# Patient Record
Sex: Female | Born: 1935 | Race: White | Hispanic: No | Smoking: Never smoker
Health system: Southern US, Community
[De-identification: ages and names within clinical notes are randomized; demographics above are authoritative.]

## PROBLEM LIST (undated history)

## (undated) DIAGNOSIS — F0390 Unspecified dementia without behavioral disturbance: Secondary | ICD-10-CM

## (undated) DIAGNOSIS — M810 Age-related osteoporosis without current pathological fracture: Secondary | ICD-10-CM

## (undated) DIAGNOSIS — E7800 Pure hypercholesterolemia, unspecified: Secondary | ICD-10-CM

## (undated) DIAGNOSIS — I10 Essential (primary) hypertension: Secondary | ICD-10-CM

## (undated) DIAGNOSIS — R42 Dizziness and giddiness: Secondary | ICD-10-CM

## (undated) DIAGNOSIS — C801 Malignant (primary) neoplasm, unspecified: Secondary | ICD-10-CM

## (undated) DIAGNOSIS — E538 Deficiency of other specified B group vitamins: Secondary | ICD-10-CM

## (undated) DIAGNOSIS — I4891 Unspecified atrial fibrillation: Secondary | ICD-10-CM

## (undated) HISTORY — PX: SHOULDER SURGERY: SHX246

## (undated) HISTORY — DX: Unspecified atrial fibrillation: I48.91

## (undated) HISTORY — DX: Age-related osteoporosis without current pathological fracture: M81.0

## (undated) HISTORY — PX: APPENDECTOMY: SHX54

## (undated) HISTORY — PX: MANDIBLE SURGERY: SHX707

## (undated) HISTORY — DX: Unspecified dementia, unspecified severity, without behavioral disturbance, psychotic disturbance, mood disturbance, and anxiety: F03.90

## (undated) HISTORY — PX: CERVICAL DISC SURGERY: SHX588

## (undated) HISTORY — PX: LYMPHADENECTOMY: SHX15

---

## 2000-08-09 ENCOUNTER — Inpatient Hospital Stay (HOSPITAL_COMMUNITY): Admission: RE | Admit: 2000-08-09 | Discharge: 2000-08-11 | Payer: Self-pay | Admitting: Neurological Surgery

## 2000-08-09 ENCOUNTER — Encounter: Payer: Self-pay | Admitting: Neurological Surgery

## 2000-09-09 ENCOUNTER — Encounter: Payer: Self-pay | Admitting: Neurological Surgery

## 2000-09-09 ENCOUNTER — Encounter: Admission: RE | Admit: 2000-09-09 | Discharge: 2000-09-09 | Payer: Self-pay | Admitting: Neurological Surgery

## 2002-01-07 ENCOUNTER — Emergency Department (HOSPITAL_COMMUNITY): Admission: EM | Admit: 2002-01-07 | Discharge: 2002-01-07 | Payer: Self-pay | Admitting: Internal Medicine

## 2003-10-07 ENCOUNTER — Ambulatory Visit (HOSPITAL_COMMUNITY): Admission: RE | Admit: 2003-10-07 | Discharge: 2003-10-07 | Payer: Self-pay | Admitting: Family Medicine

## 2004-05-27 ENCOUNTER — Ambulatory Visit (HOSPITAL_COMMUNITY): Admission: RE | Admit: 2004-05-27 | Discharge: 2004-05-27 | Payer: Self-pay | Admitting: Internal Medicine

## 2004-05-27 HISTORY — PX: COLONOSCOPY: SHX174

## 2005-03-03 ENCOUNTER — Ambulatory Visit (HOSPITAL_COMMUNITY): Admission: RE | Admit: 2005-03-03 | Discharge: 2005-03-03 | Payer: Self-pay | Admitting: Family Medicine

## 2005-04-01 ENCOUNTER — Ambulatory Visit (HOSPITAL_COMMUNITY): Admission: RE | Admit: 2005-04-01 | Discharge: 2005-04-01 | Payer: Self-pay | Admitting: Family Medicine

## 2006-07-25 ENCOUNTER — Ambulatory Visit (HOSPITAL_COMMUNITY): Admission: RE | Admit: 2006-07-25 | Discharge: 2006-07-25 | Payer: Self-pay | Admitting: Obstetrics and Gynecology

## 2006-10-16 ENCOUNTER — Emergency Department (HOSPITAL_COMMUNITY): Admission: EM | Admit: 2006-10-16 | Discharge: 2006-10-16 | Payer: Self-pay | Admitting: Emergency Medicine

## 2006-10-25 ENCOUNTER — Ambulatory Visit (HOSPITAL_COMMUNITY): Admission: RE | Admit: 2006-10-25 | Discharge: 2006-10-25 | Payer: Self-pay | Admitting: Family Medicine

## 2007-02-07 ENCOUNTER — Ambulatory Visit (HOSPITAL_COMMUNITY): Admission: RE | Admit: 2007-02-07 | Discharge: 2007-02-07 | Payer: Self-pay | Admitting: Family Medicine

## 2007-06-27 ENCOUNTER — Emergency Department (HOSPITAL_COMMUNITY): Admission: EM | Admit: 2007-06-27 | Discharge: 2007-06-27 | Payer: Self-pay | Admitting: Emergency Medicine

## 2007-09-28 ENCOUNTER — Inpatient Hospital Stay (HOSPITAL_COMMUNITY): Admission: EM | Admit: 2007-09-28 | Discharge: 2007-10-01 | Payer: Self-pay | Admitting: Emergency Medicine

## 2008-07-18 ENCOUNTER — Emergency Department (HOSPITAL_COMMUNITY): Admission: EM | Admit: 2008-07-18 | Discharge: 2008-07-18 | Payer: Self-pay | Admitting: Emergency Medicine

## 2008-09-30 ENCOUNTER — Emergency Department (HOSPITAL_COMMUNITY): Admission: EM | Admit: 2008-09-30 | Discharge: 2008-09-30 | Payer: Self-pay | Admitting: Emergency Medicine

## 2008-12-23 ENCOUNTER — Ambulatory Visit (HOSPITAL_COMMUNITY): Admission: RE | Admit: 2008-12-23 | Discharge: 2008-12-23 | Payer: Self-pay | Admitting: Family Medicine

## 2009-05-12 ENCOUNTER — Encounter: Payer: Self-pay | Admitting: Emergency Medicine

## 2009-05-12 ENCOUNTER — Inpatient Hospital Stay (HOSPITAL_COMMUNITY): Admission: EM | Admit: 2009-05-12 | Discharge: 2009-05-13 | Payer: Self-pay | Admitting: Cardiology

## 2009-10-28 HISTORY — PX: CARDIAC CATHETERIZATION: SHX172

## 2009-11-23 ENCOUNTER — Observation Stay (HOSPITAL_COMMUNITY): Admission: EM | Admit: 2009-11-23 | Discharge: 2009-11-26 | Payer: Self-pay | Admitting: Emergency Medicine

## 2009-11-25 ENCOUNTER — Other Ambulatory Visit: Payer: Self-pay | Admitting: Cardiology

## 2009-11-25 ENCOUNTER — Other Ambulatory Visit: Payer: Self-pay | Admitting: Internal Medicine

## 2009-11-26 ENCOUNTER — Other Ambulatory Visit: Payer: Self-pay | Admitting: Cardiology

## 2010-08-31 ENCOUNTER — Emergency Department (HOSPITAL_COMMUNITY)
Admission: EM | Admit: 2010-08-31 | Discharge: 2010-08-31 | Payer: Self-pay | Source: Home / Self Care | Admitting: Emergency Medicine

## 2010-11-09 LAB — DIFFERENTIAL
Basophils Absolute: 0 10*3/uL (ref 0.0–0.1)
Lymphs Abs: 1.4 10*3/uL (ref 0.7–4.0)
Monocytes Absolute: 0.8 10*3/uL (ref 0.1–1.0)
Monocytes Relative: 11 % (ref 3–12)
Neutro Abs: 5.1 10*3/uL (ref 1.7–7.7)
Neutrophils Relative %: 69 % (ref 43–77)

## 2010-11-09 LAB — BASIC METABOLIC PANEL
BUN: 15 mg/dL (ref 6–23)
GFR calc Af Amer: >60 mL/min (ref >60)
GFR calc non Af Amer: 54 mL/min — ABNORMAL LOW (ref >60)
Glucose, Bld: 114 mg/dL — ABNORMAL HIGH (ref 70–99)
Sodium: 144 mEq/L (ref 135–145)

## 2010-11-09 LAB — CBC
Hemoglobin: 13.5 g/dL (ref 12.0–15.0)
MCH: 31.8 pg (ref 26.0–34.0)
Platelets: 200 10*3/uL (ref 150–400)
RBC: 4.25 MIL/uL (ref 3.87–5.11)
WBC: 7.4 10*3/uL (ref 4.0–10.5)

## 2010-11-20 LAB — BASIC METABOLIC PANEL
BUN: 16 mg/dL (ref 6–23)
CO2: 25 mEq/L (ref 19–32)
Calcium: 8 mg/dL — ABNORMAL LOW (ref 8.4–10.5)
Chloride: 113 mEq/L — ABNORMAL HIGH (ref 96–112)
Creatinine, Ser: 1 mg/dL (ref 0.4–1.2)
GFR calc Af Amer: >60 mL/min (ref >60)
GFR calc non Af Amer: 54 mL/min — ABNORMAL LOW (ref >60)
Glucose, Bld: 88 mg/dL (ref 70–99)
Potassium: 3.9 mEq/L (ref 3.5–5.1)
Sodium: 140 mEq/L (ref 135–145)
Sodium: 142 mEq/L (ref 135–145)

## 2010-11-20 LAB — CARDIAC PANEL(CRET KIN+CKTOT+MB+TROPI)
Relative Index: INVALID (ref 0.0–2.5)
Total CK: 45 U/L (ref 7–177)
Troponin I: <0.01 ng/mL (ref 0.00–0.06)

## 2010-11-20 LAB — PROTIME-INR
INR: 1.28 (ref 0.00–1.49)
Prothrombin Time: 13.7 seconds (ref 11.6–15.2)
Prothrombin Time: 15.5 seconds — ABNORMAL HIGH (ref 11.6–15.2)

## 2010-11-20 LAB — HEPARIN LEVEL (UNFRACTIONATED): Heparin Unfractionated: 0.76 IU/mL — ABNORMAL HIGH (ref 0.30–0.70)

## 2010-11-22 LAB — BASIC METABOLIC PANEL
BUN: 17 mg/dL (ref 6–23)
CO2: 30 mEq/L (ref 19–32)
Chloride: 104 mEq/L (ref 96–112)
Potassium: 3.4 mEq/L — ABNORMAL LOW (ref 3.5–5.1)
Sodium: 140 mEq/L (ref 135–145)

## 2010-11-22 LAB — POCT CARDIAC MARKERS
Myoglobin, poc: 64.8 ng/mL (ref 12–200)
Troponin i, poc: <0.05 ng/mL (ref 0.00–0.09)

## 2010-11-22 LAB — DIFFERENTIAL
Basophils Absolute: 0 10*3/uL (ref 0.0–0.1)
Eosinophils Absolute: 0.2 10*3/uL (ref 0.0–0.7)
Eosinophils Relative: 3 % (ref 0–5)
Lymphocytes Relative: 27 % (ref 12–46)
Lymphs Abs: 2.1 10*3/uL (ref 0.7–4.0)
Monocytes Absolute: 0.7 10*3/uL (ref 0.1–1.0)
Monocytes Relative: 9 % (ref 3–12)
Neutro Abs: 4.7 10*3/uL (ref 1.7–7.7)

## 2010-11-22 LAB — COMPREHENSIVE METABOLIC PANEL
CO2: 29 mEq/L (ref 19–32)
Chloride: 101 mEq/L (ref 96–112)
GFR calc non Af Amer: 54 mL/min — ABNORMAL LOW (ref >60)
Potassium: 3.1 mEq/L — ABNORMAL LOW (ref 3.5–5.1)

## 2010-11-22 LAB — CBC
Hemoglobin: 14.1 g/dL (ref 12.0–15.0)
MCV: 94.2 fL (ref 78.0–100.0)
RBC: 4.37 MIL/uL (ref 3.87–5.11)

## 2010-11-22 LAB — PROTIME-INR
INR: 2.01 — ABNORMAL HIGH (ref 0.00–1.49)
Prothrombin Time: 24.8 seconds — ABNORMAL HIGH (ref 11.6–15.2)

## 2010-11-22 LAB — CARDIAC PANEL(CRET KIN+CKTOT+MB+TROPI)
CK, MB: 0.5 ng/mL (ref 0.3–4.0)
Total CK: 31 U/L (ref 7–177)
Total CK: 37 U/L (ref 7–177)

## 2010-12-04 LAB — CBC
HCT: 40.8 % (ref 36.0–46.0)
Hemoglobin: 13.9 g/dL (ref 12.0–15.0)
Hemoglobin: 15.1 g/dL — ABNORMAL HIGH (ref 12.0–15.0)
MCHC: 34.2 g/dL (ref 30.0–36.0)
MCHC: 34.7 g/dL (ref 30.0–36.0)
MCV: 97.1 fL (ref 78.0–100.0)
RBC: 4.2 MIL/uL (ref 3.87–5.11)
RBC: 4.53 MIL/uL (ref 3.87–5.11)
RDW: 13 % (ref 11.5–15.5)
WBC: 6.2 10*3/uL (ref 4.0–10.5)

## 2010-12-04 LAB — DIFFERENTIAL
Basophils Absolute: 0 10*3/uL (ref 0.0–0.1)
Basophils Relative: 1 % (ref 0–1)
Lymphocytes Relative: 22 % (ref 12–46)
Monocytes Absolute: 0.9 10*3/uL (ref 0.1–1.0)
Monocytes Relative: 13 % — ABNORMAL HIGH (ref 3–12)
Neutro Abs: 4.5 10*3/uL (ref 1.7–7.7)
Neutrophils Relative %: 62 % (ref 43–77)

## 2010-12-04 LAB — POCT CARDIAC MARKERS
CKMB, poc: <1 ng/mL — ABNORMAL LOW (ref 1.0–8.0)
Myoglobin, poc: 83.5 ng/mL (ref 12–200)
Troponin i, poc: <0.05 ng/mL (ref 0.00–0.09)

## 2010-12-04 LAB — BASIC METABOLIC PANEL
CO2: 30 mEq/L (ref 19–32)
Calcium: 9.6 mg/dL (ref 8.4–10.5)
Creatinine, Ser: 1.14 mg/dL (ref 0.4–1.2)
GFR calc Af Amer: 57 mL/min — ABNORMAL LOW (ref >60)
GFR calc non Af Amer: 47 mL/min — ABNORMAL LOW (ref >60)
Sodium: 139 mEq/L (ref 135–145)

## 2010-12-04 LAB — PROTIME-INR
INR: 2.2 — ABNORMAL HIGH (ref 0.00–1.49)
Prothrombin Time: 22.2 seconds — ABNORMAL HIGH (ref 11.6–15.2)
Prothrombin Time: 24.2 seconds — ABNORMAL HIGH (ref 11.6–15.2)

## 2010-12-04 LAB — COMPREHENSIVE METABOLIC PANEL
BUN: 15 mg/dL (ref 6–23)
CO2: 26 mEq/L (ref 19–32)
Calcium: 9.1 mg/dL (ref 8.4–10.5)
Chloride: 102 mEq/L (ref 96–112)
Creatinine, Ser: 0.86 mg/dL (ref 0.4–1.2)
GFR calc non Af Amer: >60 mL/min (ref >60)
Glucose, Bld: 86 mg/dL (ref 70–99)
Total Bilirubin: 0.6 mg/dL (ref 0.3–1.2)

## 2010-12-04 LAB — D-DIMER, QUANTITATIVE: D-Dimer, Quant: 0.22 ug/mL-FEU (ref 0.00–0.48)

## 2010-12-04 LAB — CK TOTAL AND CKMB (NOT AT ARMC)
CK, MB: 0.5 ng/mL (ref 0.3–4.0)
Relative Index: INVALID (ref 0.0–2.5)
Total CK: 46 U/L (ref 7–177)

## 2010-12-04 LAB — MAGNESIUM: Magnesium: 2.2 mg/dL (ref 1.5–2.5)

## 2010-12-15 LAB — POCT I-STAT, CHEM 8
Chloride: 106 mEq/L (ref 96–112)
HCT: 47 % — ABNORMAL HIGH (ref 36.0–46.0)
Hemoglobin: 16 g/dL — ABNORMAL HIGH (ref 12.0–15.0)
Potassium: 3.4 mEq/L — ABNORMAL LOW (ref 3.5–5.1)
Sodium: 138 mEq/L (ref 135–145)

## 2011-01-12 NOTE — Group Therapy Note (Signed)
Jaclyn Schultz, FREEZE                ACCOUNT NO.:  [id number]   MEDICAL RECORD NO.:  [id number]          PATIENT TYPE:  INP   LOCATION:  A328                          FACILITY:  [facility]   PHYSICIAN:  Mila Homer. Sudie Bailey, M.D.DATE OF BIRTH:  04-Jan-1934   DATE OF PROCEDURE:  DATE OF DISCHARGE:                                 PROGRESS NOTE   SUBJECTIVE:  GENERAL:  She is a little stronger but last night she had  soaking sweats and has now begun to cough up thick green sputum.   OBJECTIVE:  VITALS:  Temperature 99.1, pulse 61, respiratory rate 18,  blood pressure 123/74, temperature last night around midnight was 101.6,  however.  GENERAL:  Today she is sitting up.  Color is good.  She appears oriented  and alert.  She is in no acute distress.  Well-developed, well-  nourished.  LUNGS:  Show decreased breath sounds throughout but she is moving air  well without intercostal retraction without use accessory muscles of  respiration.  HEART:  Regular rhythm rate of about 70 with decreased heart sounds.  There is no edema of the ankles.  B-MET today is normal.   ASSESSMENT:  1. Hypotension which is resolved.  2. Question bacterial bronchitis.  3. Hypokalemia improved.   PLAN:  Will DC IV and switch to Hep-Lock, obtain a sputum for gram stain  C&S. Started on albuterol by nebulizer q.4 h while awake and Levaquin 5  mg daily.  She has already been seen by physical therapy, evaluation and  they feel no physical therapy is really needed but she does need to be  ambulated at home, with a walker by nursing.  Review of that note.      Mila Homer. Sudie Bailey, M.D.  Electronically Signed     SDK/MEDQ  D:  09/30/2007  T:  09/30/2007  Job:  [id number]

## 2011-01-12 NOTE — Group Therapy Note (Signed)
Jaclyn Schultz, Jaclyn Schultz                ACCOUNT NO.:  [id number]   MEDICAL RECORD NO.:  [id number]          PATIENT TYPE:  INP   LOCATION:  A328                          FACILITY:  [facility]   PHYSICIAN:  Mila Homer. Sudie Bailey, M.D.DATE OF BIRTH:  [birth year]   DATE OF PROCEDURE:  DATE OF DISCHARGE:                                 PROGRESS NOTE   SUBJECTIVE:  She still feels weak.  Furthermore she notes she is having  problem remembering words on occasion, and has been ordered for months  gradually getting worse.  She says she has had occasional pain in the  left chest wall that last for small amount of time and that she has had  workup this by cardiology.   OBJECTIVE:  Temperature is 38 degrees, pulse 71 rest rate 20, blood  pressure 124/66.  She is supine in bed appears to be alert, oriented, no acute distress.  Well-developed, well-nourished.  Color is good.  The heart is regular rhythm rate of 70.  The lungs are clear throughout moving air well.  ABDOMEN:  Soft, organomegaly or mass.   Today's white cell count 4500 H&H 11.5, 72.6.  Met7 showed shows a  potassium 3.3 appearing 3.4 yesterday.   ASSESSMENT:  1. Hypokalemia, which is cleared on metoprolol and IV fluids.  2. Hypokalemia.  3. Nasal stuffiness.  4. Memory loss, probably memory loss of the elderly.  5. Left chest pain.   PLAN:  Increase KCl from 20 mEq daily to 20 mg t.i.d.  Her cutting down  her IV from 150 to 110-hour.  She be up in the chair.  Physical therapy  to see her.  Talked her family present about memory loss and review this  later in the office.  Concerning her chest discomfort will review  everything done so far and get further workup outpatient.      Mila Homer. Sudie Bailey, M.D.  Electronically Signed     SDK/MEDQ  D:  09/29/2007  T:  09/29/2007  Job:  [id number]

## 2011-01-12 NOTE — H&P (Signed)
Jaclyn Schultz, Jaclyn Schultz                ACCOUNT NO.:  [id number]   MEDICAL RECORD NO.:  [id number]          PATIENT TYPE:  OBV   LOCATION:  A328                          FACILITY:  [facility]   PHYSICIAN:  Mila Homer. Sudie Bailey, M.D.DATE OF BIRTH:  1934-01-23   DATE OF ADMISSION:  09/27/2007  DATE OF DISCHARGE:  LH                              HISTORY & PHYSICAL   This 77 year old was found by family this morning on the floor  apparently with a blood pressure of 80 systolic.   She was seen in the office several days ago, felt to have a viral URI.  Since then she tells me she has been terribly cold the whole time.  Denies aches but she has had a tremendous amount of clear rhinorrhea and  constant sneezing.  She has been weak with this.   CURRENT MEDICATIONS:  1. Hydrochlorothiazide 12.5 mg daily.  2. Nitrofurantoin 100 mg q.h.s.  3. Vytorin 10/20 daily.  4. Metoprolol 25 mg b.i.d.  5. Kay Ciel 20 mEq daily.  6. Metoclopramide 10 mg p.r.n. dizziness.  7. Coumadin 2 mg for anticoagulation.  8. Vitamin C 1000 mg daily.  9. Calcium 600 mg daily.   PAST MEDICAL HISTORY:  1. She has a history of atrial fibrillation.  2. Degenerative disk disease.  3. Hypercholesterolemia.  4. Hypertension.  5. Osteoporosis.  6. Recurrent urinary tract infections.   SOCIAL HISTORY:  She lives with her son.  There is no history of drug  abuse, smoking or drinking.   ADMISSION EXAM:  Showed a pleasant, elderly woman who was somewhat  hypotensive initially but the pressure did quite well after two IV  infusions of 500 cc normal saline.  Temperature was 99.2, pulse 84,  respiratory rate 20, blood pressure 123/58. She was supine in bed at the  time I examined her.  She was alert and oriented, in no acute distress,  well-developed, well-nourished.  HEENT:  Mucous membranes were moist. Skin turgor was normal.  NECK:  There was no axillary, supraclavicular adenopathy.  LUNGS:  Clear throughout, moving air well.  HEART:  Had a fairly regular rhythm, rate of about 70.  ABDOMEN:  Soft without organomegaly, mass or tenderness.  EXTREMITIES:  There was no edema in the ankles.   LABORATORY DATA:  Admission white cell count is 8300 of which 80% were  neutrophils, 8 lymphs.  Sodium was 134, BUN 18, creatinine 1.33.  Admission Myoglobin was elevated at 377 but the CK-MB was less than 1.0  and the troponin less than 0.05.  Urine showed positive ketones, trace  protein and specific gravity was 1.020, there were 3-6 wbc's, 0-2  rbc's/hpf.  Oxygen saturation on room air was 99%.   ADMISSION DIAGNOSES:  1. Dehydration (ketones were positive in urine).  2. Probable influenza. This is an unusual presentation since she did      have a flu shot.  3. Hypercholesterolemia.  4. Chronic recurrent atrial fibrillation.  5. Anticoagulation.  6. Benign essential hypertension.  7. Electrolyte disturbances (sodium 134).   At present she is on normal saline IV 100 mL  an hour. Will continue  this, add a regular diet since she is hungry, and do a nasal swab for  flu, also start Tamiflu 75 mg p.o. b.i.d. and get her back on Metoprolol  25 mg b.i.d. and nitrofurantoin 100 mg q.h.s.  Reviewed her EKG which  presently shows a normal sinus rhythm.      Mila Homer. Sudie Bailey, M.D.  Electronically Signed     SDK/MEDQ  D:  09/27/2007  T:  09/27/2007  Job:  [id number]

## 2011-01-12 NOTE — Group Therapy Note (Signed)
Jaclyn Schultz, Jaclyn Schultz                ACCOUNT NO.:  [id number]   MEDICAL RECORD NO.:  [id number]          PATIENT TYPE:  INP   LOCATION:  A328                          FACILITY:  [facility]   PHYSICIAN:  Mila Homer. Sudie Bailey, M.D.DATE OF BIRTH:  [birth year]   DATE OF PROCEDURE:  09/28/2007  DATE OF DISCHARGE:                                 PROGRESS NOTE   Generally feels well.  Last night around 5:00 p.m. developed diarrhea  and has had an overnight.  Nursing called this morning saying they got a  pressure of 80/40.   OBJECTIVE:  Temperature 97.3, pulse 73, respiratory rate 20, blood  pressure 76/45.  She is supine in bed.  No acute distress.  Well-developed, well-  nourished.  Sensorium is intact.  Sentence structure normal.  Skin turgor is normal.  Mucous membranes moist.  HEART:  Irregular rhythm rate of 70.  LUNGS:  Clear throughout.  ABDOMEN:  Soft without organomegaly or mass or tenderness.   Her urine cultures sensitive was negative.  A met7 showed potassium 3.4,  albumin was 2.8 and CBC showed a white cell count 4900 with 49%  neutrophils, 37 lymphs.  Platelet count of 127,000.   ASSESSMENT:  1. Hypotension.  2. Atrial fibrillation.  3. Anticoagulation.  4. Hypokalemia.   PLAN:  She has had a bolus of IV and pressure has gone up a little bit.  Increase the IV normal saline 150 an hour.  Present hold the metoprolol.  Continue on Coumadin for anticoagulation.  Add potassium runs today.  Recheck CBC met7 the morning.  Discussed all this with the patient and  daughter, who is in attendance.      Mila Homer. Sudie Bailey, M.D.  Electronically Signed     SDK/MEDQ  D:  09/28/2007  T:  09/28/2007  Job:  [id number]

## 2011-01-12 NOTE — Discharge Summary (Signed)
Jaclyn Schultz, Jaclyn Schultz                ACCOUNT NO.:  [id number]   MEDICAL RECORD NO.:  [id number]          PATIENT TYPE:  INP   LOCATION:  A328                          FACILITY:  [facility]   PHYSICIAN:  Mila Homer. Sudie Bailey, M.D.DATE OF BIRTH:  August 26, 1934   DATE OF ADMISSION:  09/27/2007  DATE OF DISCHARGE:  02/01/2009LH                               DISCHARGE SUMMARY   This is a 77 year old admitted to the hospital with what appeared to be  dehydration secondary to viral syndrome.  She had a benign five-day  hospitalization extending from September 27, 2007, to October 01, 2007,  with vital signs remaining stable.   ADMISSION LABS:  Admission white blood cell count was 8300 and H&H was  13.4 and 41.1.  She had 80% neutrophils and 8 lymphs.  BMP showed BUN  18, creatinine 1.33.  Cardiac markers showed a myoglobin 37.7,  __________  1251 (INR 1.1).  UA showed 40 ketones, small blood, trace  protein, 3-6 WBC, 0-2 RBC, trace PF.  Recheck INR was 1.2.  __________  ANC was negative.  Recheck white cell count was 4900.  __________  3.4.  Further recheck of the hemoglobin the third day showed it dropped to  11.5, platelet count had dropped to 150,000 with potassium of 3.3.  Today prior to discharge potassium was up to 3.8.  Urine culture showed  insignificant growth.   Portable chest x-ray showed calcifications of the upper thoracic aorta,  otherwise was normal.   HOSPITAL COURSE:  She was admitted to the hospital on admission  observation, vitals q.4 hours, and IV normal saline 100 mL an hour with  clear liquids.  Later that day she was put on a regular diet.  She was  started on Tamiflu 75 mg b.i.d. and Macrobid 100 mg q.h.s. with  metoprolol 25 mg b.i.d., KCL 20 mEq daily.  When it was found that her  nasal washings were negative for influenza, the Tamiflu was stopped.  She was put on Lomotil for diarrhea.  The following day she had a  problems with hypotension again (she had this on  admission), so we held  her Lopressor, gave her a bolus of 500 mL of normal saline.  That day IV  normal saline was increased to 150 mL an hour.  Patient made full  admission.  The metoprolol was discontinued.  The following day we were  able to decrease the IV to 100 mL an hour, increased the potassium to 20  mEq t.i.d.  We used Afrin for nasal congestion, but then she started to  cough up some thick green sputum, so we asked for sputum for CNS.  At  that time she was doing better and we were able to discontinue her IV.  We put her on Hep-Lock and started Levaquin 500 mg daily and albuterol  by nebulizer.   By the fifth day she was much improved and ready for discharge home.  She tolerated metoprolol 12.5 mg b.i.d.  She was discharged.   DISCHARGE DIAGNOSES:  1. Hypotension probably secondary to dehydration.  2. Dehydration, as evidenced by  a change in her hemoglobin from      admission of 14.4 to a hemoglobin of 11.5 three days later without      any evidence of gastrointestinal bleeding.  3. Paroxysmal atrial fibrillation.  4. Anticoagulation.  5. Hypertension.  6. Hypo electrolyte disturbance including hypokalemia and      hypocythemia.  7. Hypercholesterolemia.  8. Bacterial bronchitis.   DISCHARGE MEDICATIONS:  1. Levaquin 500 mg daily x7 days.  2. Metoprolol decreased from 25 mg b.i.d. to 12.5 mg b.i.d.  3. Nitrofurantoin 100 mg q.h.s. for chronic recurrent urinary tract      infection.  4. KCL 20 mEq should be t.i.d. at present.  5. Vytorin 10/20 mg daily.  6. Calcium with vitamin D 600 daily.  7. Coumadin as per [facility].   FOLLOWUP:  In the office in a week.      Mila Homer. Sudie Bailey, M.D.  Electronically Signed     SDK/MEDQ  D:  10/01/2007  T:  10/02/2007  Job:  [id number]

## 2011-01-15 NOTE — Op Note (Signed)
[place]. [facility]  Patient:    Jaclyn Schultz, Jaclyn Schultz                         MRN: [id number] Proc. Date: 08/09/00 Adm. Date:  [id number] Attending:  Jonne Ply                           Operative Report  PREOPERATIVE DIAGNOSIS:  C4-5 cervical spondylosis plus herniated nucleus pulposus with left cervical radiculopathy.  POSTOPERATIVE DIAGNOSIS:  C4-5 cervical spondylosis plus herniated nucleus pulposus with left cervical radiculopathy.  Status post cervical arthrodesis C5 to C7 1999.  OPERATION:  Anterior cervical diskectomy C4-5, structural allograft Synthes fixation, removal of old hardware C5 to C7.  SURGEON:  Stefani Dama, M.D.  FIRST ASSISTANT:  Cristi Loron, M.D.  ANESTHESIA:  General endotracheal.  INDICATIONS:  The patient is a 77 year old individual, who has had significant neck, shoulder and left arm pain for a number of months.  She has had a previous shoulder difficulty and this had been worked up.  However, it was felt that she was exhibiting a cervical radiculopathy and an MRI was ordered. The study demonstrated that she had significant spondylitic degeneration in addition to an acute disk herniation at C4-5 on the left hand side.  She was advised regarding the surgical intervention as she has failed at efforts at conservative management.  DESCRIPTION OF PROCEDURE:  The patient was brought to the operating room, placed on table in supine position.  After smooth induction of general endotracheal anesthesia, her head was placed in 5 pounds of Holter traction. The neck was shaved, prepped with Duraprep and draped in a sterile fashion.  A transverse incision was made and the cervical spine in the neck up a above the previous incision.  The dissection was carried down through the platysma and the plane between the sternocleidomastoid and the strap muscles was dissected bluntly until the prevertebral space was reached.  The  hardware was identified and then this area was dissected free to expose the anterior plate from C5 to C7.  After removal of the locking screws, the six 4 x 14 mm screws were removed and the plate was removed.  The area just above this, which was the C4-5 disk space was then explored.  A large ventral osteophyte, which had overgrown the space was removed with a Leksell rongeur.  The disk space was entered and then after removing the ventral portion of disk with a combination of curets and rongeurs, a self-retaining disk spreader was placed into the wound.  The uncinate process was then drilled down with Anspach drill and an A2 bur.  Once the lateral recess was cleared, the common dural tube and the takeoff at the region of the C5 nerve root was identified.  This area was decompressed.  Hemostasis from some bleeding, epidural veins was obtained with some bipolar cautery and some pledgets of Gelfoam soaked in thrombin, which were later removed.  The patient was turned to the left-sided lateral recess, where here a large uncinate spur was again drilled down.  A fragment of disk was identified protruding through the open posterior longitudinal ligament. This was resected and this allowed for good decompression of the common dural tube and the C5 nerve root on the left side.  Hemostasis was similarly achieved.  Then, a 6 mm round fibular graft was placed into the interspace after drilling the endplates  appropriately to fit the graft.  Traction was removed and the neck was placed in slight flexion and a 16 mm Synthes plate was affixed with four locking 4 x 14 mm screws.  Localizing radiograph identified good position of the endplate and the graft.  Area was then checked for hemostasis in the soft tissues and the platysma was closed with 2-0 Vicryl interrupted fashion and 3-0 Vicryl was used subcuticularly to close the skin. The patient tolerated the procedure well and was returned to the recovery  room in stable condition. DD:  08/09/00 TD:  08/09/00 Job: [id number]

## 2011-01-15 NOTE — Op Note (Signed)
Jaclyn Schultz, Jaclyn Schultz                ACCOUNT NO.:  [id number]   MEDICAL RECORD NO.:  [id number]          PATIENT TYPE:  AMB   LOCATION:  DAY                           FACILITY:  [facility]   PHYSICIAN:  R. Roetta Sessions, M.D. DATE OF BIRTH:  10/04/1933   DATE OF PROCEDURE:  05/27/2004  DATE OF DISCHARGE:                                 OPERATIVE REPORT   PROCEDURE:  Screening colonoscopy.   INDICATION FOR PROCEDURE:  The patient is a 77 year old Caucasian female  devoid of any GI symptoms, referred out of the courtesy of Mila Homer.  Sudie Bailey, M.D., for colorectal cancer screening.  She has never had a family  history.  Family history is significant in that her sister in her 9s  succumbed to colorectal cancer.  Colonoscopy is now being done.  The  approach has been discussed with the patient at length.  The potential  risks, benefits, and alternatives have been reviewed, questions answered.  Please see my handwritten H&P for more information.   PROCEDURE NOTE:  O2 saturation, blood pressure, pulse, and respirations were  monitored throughout the entire procedure.   CONSCIOUS SEDATION:  Versed 3 mg IV, Demerol 50 mg IV in divided doses.   INSTRUMENT USED:  Olympus video chip system.   FINDINGS:  Digital exam revealed no abnormalities.   ENDOSCOPIC FINDINGS:  Prep was good.   Rectum:  Examination of the rectal mucosa including a retroflexed view of  the anal verge revealed only internal hemorrhoids and a couple of anal  papillae.   Colon:  The colonic mucosa was surveyed from the rectosigmoid junction  through the left, transverse, and right colon, to the area of the  appendiceal orifice, ileocecal valve, and cecum.  These structures were well-  seen and photographed for the record.  From this point the scope was slowly  withdrawn.  All previously-mentioned mucosal surfaces were again seen.  The  patient had sigmoid diverticula.  The remainder of the colonic mucosa  appeared normal.   The patient tolerated the procedure well, was reacted in  endoscopy.   IMPRESSION:  1.  Internal hemorrhoids and anal papillae, otherwise normal rectum.  2.  Sigmoid diverticula.  The remainder of the colonic mucosa appeared      normal.   RECOMMENDATIONS:  1.  Diverticular literature was given to Ms. Mcgue.  2.  Recommend a repeat colonoscopy in five years for high-risk screening      purposes.     Otelia Sergeant   RMR/MEDQ  D:  05/27/2004  T:  05/27/2004  Job:  [id number]   cc:   Mila Homer. Sudie Bailey, M.D.  [address] [place], [place] [address]  Fax: [phone]

## 2011-04-19 ENCOUNTER — Emergency Department (HOSPITAL_COMMUNITY): Payer: Medicare Other

## 2011-04-19 ENCOUNTER — Encounter: Payer: Self-pay | Admitting: *Deleted

## 2011-04-19 ENCOUNTER — Emergency Department (HOSPITAL_COMMUNITY)
Admission: EM | Admit: 2011-04-19 | Discharge: 2011-04-19 | Disposition: A | Discharge status: Home / Self Care | Payer: Medicare Other | Attending: Emergency Medicine | Admitting: Emergency Medicine

## 2011-04-19 ENCOUNTER — Other Ambulatory Visit: Payer: Self-pay

## 2011-04-19 DIAGNOSIS — R42 Dizziness and giddiness: Secondary | ICD-10-CM | POA: Insufficient documentation

## 2011-04-19 DIAGNOSIS — R791 Abnormal coagulation profile: Secondary | ICD-10-CM | POA: Insufficient documentation

## 2011-04-19 HISTORY — DX: Dizziness and giddiness: R42

## 2011-04-19 HISTORY — DX: Deficiency of other specified B group vitamins: E53.8

## 2011-04-19 HISTORY — DX: Essential (primary) hypertension: I10

## 2011-04-19 HISTORY — DX: Pure hypercholesterolemia, unspecified: E78.00

## 2011-04-19 HISTORY — DX: Malignant (primary) neoplasm, unspecified: C80.1

## 2011-04-19 LAB — BASIC METABOLIC PANEL
BUN: 16 mg/dL (ref 6–23)
CO2: 28 mEq/L (ref 19–32)
Chloride: 104 mEq/L (ref 96–112)
GFR calc Af Amer: >60 mL/min (ref >60)
Potassium: 4.2 mEq/L (ref 3.5–5.1)

## 2011-04-19 LAB — DIFFERENTIAL
Basophils Relative: 0 % (ref 0–1)
Lymphocytes Relative: 18 % (ref 12–46)
Lymphs Abs: 1.2 10*3/uL (ref 0.7–4.0)
Monocytes Relative: 8 % (ref 3–12)
Neutro Abs: 5 10*3/uL (ref 1.7–7.7)
Neutrophils Relative %: 73 % (ref 43–77)

## 2011-04-19 LAB — CBC
HCT: 44.1 % (ref 36.0–46.0)
Hemoglobin: 14.6 g/dL (ref 12.0–15.0)
MCHC: 33.1 g/dL (ref 30.0–36.0)
RBC: 4.88 MIL/uL (ref 3.87–5.11)
WBC: 6.8 10*3/uL (ref 4.0–10.5)

## 2011-04-19 LAB — APTT: aPTT: 47 seconds — ABNORMAL HIGH (ref 24–37)

## 2011-04-19 MED ORDER — SODIUM CHLORIDE 0.9 % IV BOLUS (SEPSIS)
500.0000 mL | Freq: Once | INTRAVENOUS | Status: AC
  Administered 2011-04-19: 10:00:00 via INTRAVENOUS

## 2011-04-19 MED ORDER — LORAZEPAM 2 MG/ML IJ SOLN
0.5000 mg | Freq: Once | INTRAMUSCULAR | Status: AC
  Administered 2011-04-19: 0.5 mg via INTRAVENOUS
  Filled 2011-04-19: qty 1

## 2011-04-19 MED ORDER — DIAZEPAM 5 MG/ML IJ SOLN: 2.5000 mg | Freq: Once | INTRAMUSCULAR | Status: DC

## 2011-04-19 NOTE — ED Provider Notes (Signed)
History    Scribed for [name], MD, the patient was seen in room APA18/APA18. This chart was scribed by Clarita Crane. This patient's care was started at 8:54AM.  CSN: [id number] Arrival date & time: 04/19/2011  8:37 AM  Chief Complaint  Patient presents with  . Dizziness   HPI Jaclyn Schultz is a 77 y.o. female who presents to the Emergency Department after referral by PCP requesting patient receive CT-Head due to dizziness. Patient complains of intermittent dizziness/lightheadedness onset 2 months ago but worse this morning with associated weakness. Denies HA, changes in vision, recent head injury or fall, chest pain, SOB, abdominal pain, n/v, numbness/tingling, tinnitus, decreased fluid/food intake. Describes dizziness as heaviness of her head and denies sensation of movement/surroundings spinning. Also states current dizziness is similar to previous episodes of dizziness experienced for a little over 2 months. Patient and family member note patient is currently being treated by PCP for dizziness and is taking meclizine which is not helping. Family member also reports she has noticed the patient will consistent drift/fall to her right while walking. Patient currently on Coumadin-5mg  daily.  HPI ELEMENTS: Onset: 2 months ago, worse this morning Duration: persistent since onset  Timing: intermittent  Quality: heaviness of her head, not sensation of movement/surroundings spinning   Context:  as above  Associated symptoms:+weakness. Denies HA, changes in vision, recent head injury or fall, chest pain, SOB, abdominal pain, n/v, numbness/tingling, tinnitus, decreased fluid/food intake  PAST MEDICAL HISTORY:  Past Medical History  Diagnosis Date  . Vitamin B 12 deficiency   . Vertigo   . High cholesterol   . Hypertension   . Cancer     mouth/dx 08-2010/surg only    PAST SURGICAL HISTORY:  Past Surgical History  Procedure Date  . Mandible surgery   . Lymphadenectomy     from mouth  due to cancer  . Appendectomy   . Abdominal hysterectomy   . Cervical disc surgery     MEDICATIONS:  Previous Medications   ALENDRONATE (FOSAMAX) 70 MG TABLET    Take 70 mg by mouth every 7 (seven) days. Take with a full glass of water on an empty stomach. Take on Mondays    CALCIUM PO    Take 1 tablet by mouth daily. Unknown strength. Take 1 chewable tablet daily    CETIRIZINE (ZYRTEC) 10 MG TABLET    Take 10 mg by mouth daily.     CHOLECALCIFEROL (VITAMIN D) 1000 UNITS TABLET    Take 1,000 Units by mouth daily.     FLUTICASONE (FLONASE) 50 MCG/ACT NASAL SPRAY    Place 2 sprays into the nose daily.     HYDROCODONE-ACETAMINOPHEN (NORCO) 7.5-325 MG PER TABLET    Take 1 tablet by mouth daily as needed. For pain    METOPROLOL TARTRATE (LOPRESSOR) 25 MG TABLET    Take 12.5-25 mg by mouth 2 (two) times daily. Take 1 tablet every morning and 1/2 tablet at night.    NITROFURANTOIN (MACRODANTIN) 100 MG CAPSULE    Take 100 mg by mouth at bedtime.     POTASSIUM CHLORIDE (KLOR-CON) 10 MEQ CR TABLET    Take 10 mEq by mouth daily.     SIMVASTATIN (ZOCOR) 40 MG TABLET    Take 40 mg by mouth every morning.     VITAMIN B-12 (CYANOCOBALAMIN) 1000 MCG TABLET    Take 1,000 mcg by mouth daily.     VITAMIN C (ASCORBIC ACID) 500 MG TABLET    Take 500 mg  by mouth daily.     WARFARIN (COUMADIN) 5 MG TABLET    Take 5-7.5 mg by mouth daily. Take 1 tablet on Monday, Wednesday, Thursday, Saturday and Sunday. Take 1.5 tablets on Tuesday and Friday. (regimen as of 04/19/11)    Coumadin-5mg  daily  ALLERGIES:  Allergies as of 04/19/2011 - Review Complete 04/19/2011  Allergen Reaction Noted  . Erythromycin base Rash 04/19/2011     FAMILY HISTORY:  No family history on file.   SOCIAL HISTORY: History   Social History  . Marital Status: Widowed        Social History Main Topics  . Smoking status: Never Smoker   . Smokeless tobacco: None  . Alcohol Use: No  . Drug Use: No  . Sexually Active:     Review of  Systems 10 Systems reviewed and are negative for acute change except as noted in the HPI.  Physical Exam  BP 151/71  Pulse 69  Temp(Src) 97.7 F (36.5 C) (Oral)  Resp 20  Ht 5\' 5"  (1.651 m)  Wt 148 lb (67.132 kg)  BMI 24.63 kg/m2  SpO2 98%  Physical Exam  Nursing note and vitals reviewed. Constitutional: She is oriented to person, place, and time. She appears well-developed and well-nourished. No distress.  HENT:  Head: Normocephalic and atraumatic.  Right Ear: Tympanic membrane normal.  Left Ear: Tympanic membrane normal.  Mouth/Throat: Oropharynx is clear and moist.  Eyes: EOM are normal. Pupils are equal, round, and reactive to light.  Neck: Normal range of motion. Neck supple.  Cardiovascular: Normal rate and regular rhythm.  Exam reveals no gallop and no friction rub.   No murmur heard. Pulmonary/Chest: Effort normal and breath sounds normal. She has no wheezes.  Abdominal: Soft. Bowel sounds are normal. She exhibits no distension. There is no tenderness.  Musculoskeletal: Normal range of motion. She exhibits no edema.  Neurological: She is alert and oriented to person, place, and time. She has normal strength. She is not disoriented. No cranial nerve deficit or sensory deficit.       No pronator drift. Strength of upper and lower extremities normal and equal. Negative finger to nose test. No focal motor or sensory deficits.   Skin: Skin is warm and dry.  Psychiatric: She has a normal mood and affect. Her behavior is normal.    ED Course  Procedures  OTHER DATA REVIEWED: Nursing notes, vital signs, and past medical records reviewed. Lab results reviewed and considered Imaging results reviewed and considered Previous medical records reviewed and considered  DIAGNOSTIC STUDIES: Oxygen Saturation is 100% on room air, normal by my interpretation.    LABS / RADIOLOGY: Results for orders placed during the hospital encounter of 04/19/11  CBC      Component Value Range     WBC 6.8  4.0 - 10.5 (K/uL)   RBC 4.88  3.87 - 5.11 (MIL/uL)   Hemoglobin 14.6  12.0 - 15.0 (g/dL)   HCT 16.1  09.6 - 04.5 (%)   MCV 90.4  78.0 - 100.0 (fL)   MCH 29.9  26.0 - 34.0 (pg)   MCHC 33.1  30.0 - 36.0 (g/dL)   RDW 40.9  81.1 - 91.4 (%)   Platelets 228  150 - 400 (K/uL)  DIFFERENTIAL      Component Value Range   Neutrophils Relative 73  43 - 77 (%)   Neutro Abs 5.0  1.7 - 7.7 (K/uL)   Lymphocytes Relative 18  12 - 46 (%)   Lymphs  Abs 1.2  0.7 - 4.0 (K/uL)   Monocytes Relative 8  3 - 12 (%)   Monocytes Absolute 0.5  0.1 - 1.0 (K/uL)   Eosinophils Relative 1  0 - 5 (%)   Eosinophils Absolute 0.1  0.0 - 0.7 (K/uL)   Basophils Relative 0  0 - 1 (%)   Basophils Absolute 0.0  0.0 - 0.1 (K/uL)  BASIC METABOLIC PANEL      Component Value Range   Sodium 140  135 - 145 (mEq/L)   Potassium 4.2  3.5 - 5.1 (mEq/L)   Chloride 104  96 - 112 (mEq/L)   CO2 28  19 - 32 (mEq/L)   Glucose, Bld 98  70 - 99 (mg/dL)   BUN 16  6 - 23 (mg/dL)   Creatinine, Ser 9.56  0.50 - 1.10 (mg/dL)   Calcium 9.6  8.4 - 21.3 (mg/dL)   GFR calc non Af Amer >60  >60 (mL/min)   GFR calc Af Amer >60  >60 (mL/min)  PROTIME-INR      Component Value Range   Prothrombin Time 38.5 (*) 11.6 - 15.2 (seconds)   INR 3.86 (*) 0.00 - 1.49   APTT      Component Value Range   aPTT 47 (*) 24 - 37 (seconds)    Ct Head Wo Contrast  04/19/2011  *RADIOLOGY REPORT*  Clinical Data: Dizziness.  Weakness.  Symptoms worsening.  CT HEAD WITHOUT CONTRAST  Technique:  Contiguous axial images were obtained from the base of the skull through the vertex without contrast.  Comparison: 06/27/2007  Findings: The brain does not show accelerated atrophy.  There is no evidence of old or acute small or large vessel infarction.  No mass lesion, hemorrhage, hydrocephalus or extra-axial collection. Sinuses are clear.  No skull or skull base lesion.  IMPRESSION: Normal head CT.  No cause of the patient's described symptoms is identified.   Original Report Authenticated By: Thomasenia Sales, M.D.     Date: 04/19/2011  Rate: 65   Rhythm: normal sinus rhythm  QRS Axis: normal  Intervals: normal  ST/T Wave abnormalities: normal  Conduction Disutrbances:none  Narrative Interpretation:   Old EKG Reviewed: unchanged   PROCEDURES:  ED COURSE / COORDINATION OF CARE: Orders Placed This Encounter  Procedures  . CT Head Wo Contrast  . CBC  . Differential  . Basic metabolic panel  . Protime-INR  . APTT  . Consult to internal medicine     MDM: Differential Diagnosis: BPPV, spontaneous ICH, posterior infarct, lemeirre's disease   PLAN:  Pt feeling better with .5mg  ativan but some lightheadedness with movement only. D/W Dr. Kem Boroughs continue management as outpatient. Would like me to call neurology. 11:49 AM  D/W Dr. Gerilyn Pilgrim, neurology. Will see pt as outpatient in the office Discussed with family. They are comfortable with the plan for dischage home. She lives with her daughters who will help with fall precautions.  The patient is to return the emergency department if there is any worsening of symptoms. I have reviewed the discharge instructions with the patient/family  CONDITION ON DISCHARGE: Stable   MEDICATIONS GIVEN IN THE E.D.  Medications  sodium chloride 0.9 % bolus 500 mL ( mL Intravenous Given 04/19/11 0943)  alendronate (FOSAMAX) 70 MG tablet (not administered)  potassium chloride (KLOR-CON) 10 MEQ CR tablet (not administered)  metoprolol tartrate (LOPRESSOR) 25 MG tablet (not administered)  simvastatin (ZOCOR) 40 MG tablet (not administered)  nitrofurantoin (MACRODANTIN) 100 MG capsule (not administered)  HYDROcodone-acetaminophen (  NORCO) 7.5-325 MG per tablet (not administered)  warfarin (COUMADIN) 5 MG tablet (not administered)  cholecalciferol (VITAMIN D) 1000 UNITS tablet (not administered)  vitamin B-12 (CYANOCOBALAMIN) 1000 MCG tablet (not administered)  vitamin C (ASCORBIC ACID) 500 MG  tablet (not administered)  cetirizine (ZYRTEC) 10 MG tablet (not administered)  fluticasone (FLONASE) 50 MCG/ACT nasal spray (not administered)  CALCIUM PO (not administered)  LORazepam (ATIVAN) injection 0.5 mg (0.5 mg Intravenous Given 04/19/11 0946)     I personally performed the services described in this documentation, which was scribed in my presence. The recorded information has been reviewed and considered. [name], MD     [name], MD 04/19/11 [phone]

## 2011-04-19 NOTE — ED Notes (Signed)
Assisted with urinal--voided approx. 300 cc's yellow clear urine.

## 2011-04-19 NOTE — ED Notes (Signed)
Dizziness x 2 months.  Is being treated by Dr. Sudie Bailey for vertigo, but states is not getting better.  Pt states sitting still she feels fine, but if she moves, she feels like she is going to pass out.  Daughter reports pt has been forgetful at times as well.

## 2011-05-20 LAB — BASIC METABOLIC PANEL
BUN: 6
CO2: 28
Calcium: 7.6 — ABNORMAL LOW
Calcium: 8.2 — ABNORMAL LOW
Chloride: 107
Creatinine, Ser: 0.81
Creatinine, Ser: 0.82
GFR calc Af Amer: >60
GFR calc non Af Amer: >60
Glucose, Bld: 89
Sodium: 140

## 2011-05-20 LAB — URINALYSIS, ROUTINE W REFLEX MICROSCOPIC
Bilirubin Urine: NEGATIVE
Nitrite: NEGATIVE
Specific Gravity, Urine: 1.02
pH: 5.5

## 2011-05-20 LAB — COMPREHENSIVE METABOLIC PANEL
ALT: 10
ALT: 11
Albumin: 2.8 — ABNORMAL LOW
Albumin: 3.2 — ABNORMAL LOW
Alkaline Phosphatase: 41
Alkaline Phosphatase: 47
BUN: 13
Chloride: 108
Potassium: 3.4 — ABNORMAL LOW
Potassium: 3.7
Sodium: 134 — ABNORMAL LOW
Sodium: 140
Total Bilirubin: 0.6
Total Protein: 6

## 2011-05-20 LAB — URINE CULTURE: Colony Count: 8000

## 2011-05-20 LAB — CBC
HCT: 36.3
Hemoglobin: 12.8
Hemoglobin: 14.4
Platelets: 116 — ABNORMAL LOW
Platelets: 127 — ABNORMAL LOW
Platelets: 140 — ABNORMAL LOW
RDW: 13.3
RDW: 13.7
WBC: 4.5
WBC: 4.9

## 2011-05-20 LAB — DIFFERENTIAL
Basophils Absolute: 0
Basophils Absolute: 0
Basophils Relative: 0
Basophils Relative: 0
Eosinophils Absolute: 0
Eosinophils Absolute: 0
Eosinophils Relative: 1
Lymphocytes Relative: 29
Lymphs Abs: 1.3
Monocytes Absolute: 0.6
Monocytes Absolute: 1
Monocytes Relative: 12
Neutro Abs: 2.4
Neutrophils Relative %: 58

## 2011-05-20 LAB — URINE MICROSCOPIC-ADD ON

## 2011-05-20 LAB — POCT CARDIAC MARKERS
CKMB, poc: <1 — ABNORMAL LOW
Troponin i, poc: <0.05

## 2011-05-20 LAB — INFLUENZA A+B VIRUS AG-DIRECT(RAPID): Inflenza A Ag: NEGATIVE

## 2011-05-20 LAB — PROTIME-INR: INR: 1.2

## 2012-08-05 ENCOUNTER — Emergency Department (HOSPITAL_COMMUNITY)
Admission: EM | Admit: 2012-08-05 | Discharge: 2012-08-05 | Disposition: A | Discharge status: Home / Self Care | Payer: Medicare Other | Attending: Emergency Medicine | Admitting: Emergency Medicine

## 2012-08-05 ENCOUNTER — Encounter (HOSPITAL_COMMUNITY): Payer: Self-pay | Admitting: Emergency Medicine

## 2012-08-05 ENCOUNTER — Emergency Department (HOSPITAL_COMMUNITY): Payer: Medicare Other

## 2012-08-05 DIAGNOSIS — M25559 Pain in unspecified hip: Secondary | ICD-10-CM | POA: Insufficient documentation

## 2012-08-05 DIAGNOSIS — Z85819 Personal history of malignant neoplasm of unspecified site of lip, oral cavity, and pharynx: Secondary | ICD-10-CM | POA: Insufficient documentation

## 2012-08-05 DIAGNOSIS — R42 Dizziness and giddiness: Secondary | ICD-10-CM | POA: Insufficient documentation

## 2012-08-05 DIAGNOSIS — E7800 Pure hypercholesterolemia, unspecified: Secondary | ICD-10-CM | POA: Insufficient documentation

## 2012-08-05 DIAGNOSIS — Z7901 Long term (current) use of anticoagulants: Secondary | ICD-10-CM | POA: Insufficient documentation

## 2012-08-05 DIAGNOSIS — E538 Deficiency of other specified B group vitamins: Secondary | ICD-10-CM | POA: Insufficient documentation

## 2012-08-05 DIAGNOSIS — Z79899 Other long term (current) drug therapy: Secondary | ICD-10-CM | POA: Insufficient documentation

## 2012-08-05 DIAGNOSIS — I10 Essential (primary) hypertension: Secondary | ICD-10-CM | POA: Insufficient documentation

## 2012-08-05 MED ORDER — OXYCODONE-ACETAMINOPHEN 5-325 MG PO TABS
1.0000 | ORAL_TABLET | Freq: Once | ORAL | Status: AC
  Administered 2012-08-05: 1 via ORAL
  Filled 2012-08-05: qty 1

## 2012-08-05 MED ORDER — OXYCODONE-ACETAMINOPHEN 5-325 MG PO TABS: 1.0000 | ORAL_TABLET | Freq: Four times a day (QID) | ORAL | Status: DC | PRN

## 2012-08-05 NOTE — ED Notes (Signed)
Patient with c/o right hip pain for several weeks. Saw Dr Sudie Bailey for this, stated it was inflammation. Given RX at that time. Patient states "my hip feels like it catches"

## 2012-08-05 NOTE — ED Provider Notes (Signed)
History  This chart was scribed for Charles B. Bernette Mayers, MD by Erskine Emery, ED Scribe. This patient was seen in room APA14/APA14 and the patient's care was started at 10:12.   CSN: [id number]  Arrival date & time 08/05/12  4098   First MD Initiated Contact with Patient 08/05/12 1012      Chief Complaint  Patient presents with  . Hip Pain    (Consider location/radiation/quality/duration/timing/severity/associated sxs/prior treatment) The history is provided by the patient and a relative. No language interpreter was used.  Jaclyn Schultz is a 78 y.o. female who presents to the Emergency Department complaining of gradually worsening right hip pain, described as like its falling out from under her, for a couple months. Pt denies any associated injury, pain in other areas, fevers, or difficulty urinating, but she reports she did have a UTI a couple weeks ago that has since cleared. Pt has been seeing her PCP, Dr. Sudie Bailey, who she last saw 2 weeks ago, for the same complaint. He noted that she has some inflammation of the area and has put her on 2 prednisone regimens, the second of which she just finished yesterday. Pt has also been taking hydrocodone for pain as needed with no relief. She has never had the area x-rayed. Pt reports she can hardly walk with walker, even though she can ordinarily walk independently. Pt takes Fosomax for her h/o osteoporosis, Macrodantin 100mg  x1/day for her URI, and Coumadin for her h/o atrial fibrillation. Pt denies any h/o blood clots.  Past Medical History  Diagnosis Date  . Vitamin B 12 deficiency   . Vertigo   . High cholesterol   . Hypertension   . Cancer     mouth/dx 08-2010/surg only    Past Surgical History  Procedure Date  . Mandible surgery   . Lymphadenectomy     from mouth due to cancer  . Appendectomy   . Abdominal hysterectomy   . Cervical disc surgery     No family history on file.  History  Substance Use Topics  . Smoking status:  Never Smoker   . Smokeless tobacco: Not on file  . Alcohol Use: No    OB History    Grav Para Term Preterm Abortions TAB SAB Ect Mult Living                  Review of Systems A complete 10 system review of systems was obtained and all systems are negative except as noted in the HPI and PMH.    Allergies  Erythromycin base  Home Medications   Current Outpatient Rx  Name  Route  Sig  Dispense  Refill  . ALENDRONATE SODIUM 70 MG PO TABS   Oral   Take 70 mg by mouth every 7 (seven) days. Take with a full glass of water on an empty stomach. Take on Mondays          . CALCIUM PO   Oral   Take 1 tablet by mouth daily. Unknown strength. Take 1 chewable tablet daily          . CETIRIZINE HCL 10 MG PO TABS   Oral   Take 10 mg by mouth daily.           Marland Kitchen VITAMIN D 1000 UNITS PO TABS   Oral   Take 1,000 Units by mouth daily.           Marland Kitchen FLUTICASONE PROPIONATE 50 MCG/ACT NA SUSP   Nasal  Place 2 sprays into the nose daily.           Marland Kitchen HYDROCODONE-ACETAMINOPHEN 7.5-325 MG PO TABS   Oral   Take 1 tablet by mouth daily as needed. For pain          . MECLIZINE HCL 12.5 MG PO TABS   Oral   Take 12.5 mg by mouth daily as needed. For dizziness          . METOPROLOL TARTRATE 25 MG PO TABS   Oral   Take 12.5-25 mg by mouth 2 (two) times daily. Take 1 tablet every morning and 1/2 tablet at night.          Marland Kitchen NITROFURANTOIN MACROCRYSTAL 100 MG PO CAPS   Oral   Take 100 mg by mouth at bedtime.           Marland Kitchen POTASSIUM CHLORIDE 10 MEQ PO TBCR   Oral   Take 10 mEq by mouth daily.           Marland Kitchen SIMVASTATIN 40 MG PO TABS   Oral   Take 40 mg by mouth every morning.           Marland Kitchen VITAMIN B-12 1000 MCG PO TABS   Oral   Take 1,000 mcg by mouth daily.           Marland Kitchen VITAMIN C 500 MG PO TABS   Oral   Take 500 mg by mouth daily.           . WARFARIN SODIUM 5 MG PO TABS   Oral   Take 5-7.5 mg by mouth daily. Take 1 tablet on Monday, Wednesday, Thursday,  Saturday and Sunday. Take 1.5 tablets on Tuesday and Friday. (regimen as of 04/19/11)            Triage Vitals: BP 164/137  Pulse 73  Temp 97.9 F (36.6 C) (Oral)  Resp 19  Ht 5\' 3"  (1.6 m)  Wt 150 lb (68.04 kg)  BMI 26.57 kg/m2  SpO2 97%  Physical Exam  Nursing note and vitals reviewed. Constitutional: She is oriented to person, place, and time. She appears well-developed and well-nourished.  HENT:  Head: Normocephalic and atraumatic.  Eyes: EOM are normal. Pupils are equal, round, and reactive to light.  Neck: Normal range of motion. Neck supple.  Cardiovascular: Normal rate, normal heart sounds and intact distal pulses.   Pulmonary/Chest: Effort normal and breath sounds normal.  Abdominal: Bowel sounds are normal. She exhibits no distension. There is no tenderness.  Musculoskeletal: Normal range of motion. She exhibits no edema and no tenderness.       Pain with internal rotation of right hip.  Neurological: She is alert and oriented to person, place, and time. She has normal strength. No cranial nerve deficit or sensory deficit.  Skin: Skin is warm and dry. No rash noted.  Psychiatric: She has a normal mood and affect.    ED Course  Procedures (including critical care time) DIAGNOSTIC STUDIES: Oxygen Saturation is 97% on room air, adequate by my interpretation.    COORDINATION OF CARE: 10:45--I evaluated the patient and we discussed a treatment plan including x-rays and pain medication (oxycodone) to which the pt agreed.   11:45--I rechecked the pt and notified her that her x-rays show arthritis. She says she is ready to go home with new pain medication.  Dg Hip Complete Right  08/05/2012  *RADIOLOGY REPORT*  Clinical Data: 78 year old female with right hip pain.  No known injury.  RIGHT HIP - COMPLETE 2+ VIEW  Comparison: 07/18/2008 abdominal radiographs  Findings: There is no evidence of fracture, subluxation or dislocation. Minimal degenerative changes in the hips  are noted. No focal bony lesions are present. Moderate degenerative changes in the lower lumbar spine are present.  IMPRESSION: No evidence of acute abnormality.  Minimal degenerative changes within both hips and moderate degenerative changes of the lower lumbar spine.   Original Report Authenticated By: Harmon Pier, M.D.       No diagnosis found.    MDM  Xray as above neg for fracture, shows arthritis in hip and low back, suspect this is a primary lumbar process causing her leg to give out. Her pain is improved and she is able to walk around the ED without falling. Advised to use a walker to prevent falls and followup with PCP for further evaluation.      I personally performed the services described in this documentation, which was scribed in my presence. The recorded information has been reviewed and is accurate.      Charles B. Bernette Mayers, MD 08/05/12 1207

## 2012-08-05 NOTE — ED Notes (Signed)
Pt ambulated in hall without difficulty.  Ambulated with mild limp and stated her hip would "catch" and pt would stop for a second, then be able to continue walking.  nad noted.

## 2012-09-13 ENCOUNTER — Other Ambulatory Visit (HOSPITAL_COMMUNITY): Payer: Self-pay | Admitting: Family Medicine

## 2012-09-13 DIAGNOSIS — M810 Age-related osteoporosis without current pathological fracture: Secondary | ICD-10-CM

## 2012-09-15 ENCOUNTER — Ambulatory Visit (HOSPITAL_COMMUNITY)
Admission: RE | Admit: 2012-09-15 | Discharge: 2012-09-15 | Disposition: A | Discharge status: Home / Self Care | Payer: Medicare Other | Source: Ambulatory Visit | Attending: Family Medicine | Admitting: Family Medicine

## 2012-09-15 DIAGNOSIS — M818 Other osteoporosis without current pathological fracture: Secondary | ICD-10-CM | POA: Insufficient documentation

## 2012-09-15 DIAGNOSIS — M810 Age-related osteoporosis without current pathological fracture: Secondary | ICD-10-CM

## 2012-11-05 ENCOUNTER — Ambulatory Visit: Payer: Self-pay | Admitting: Cardiovascular Disease

## 2013-02-28 ENCOUNTER — Ambulatory Visit (INDEPENDENT_AMBULATORY_CARE_PROVIDER_SITE_OTHER): Payer: Medicare Other | Admitting: *Deleted

## 2013-02-28 DIAGNOSIS — Z7901 Long term (current) use of anticoagulants: Secondary | ICD-10-CM

## 2013-02-28 DIAGNOSIS — I4891 Unspecified atrial fibrillation: Secondary | ICD-10-CM

## 2013-03-08 ENCOUNTER — Ambulatory Visit (INDEPENDENT_AMBULATORY_CARE_PROVIDER_SITE_OTHER): Payer: Medicare Other | Admitting: *Deleted

## 2013-03-08 DIAGNOSIS — Z7901 Long term (current) use of anticoagulants: Secondary | ICD-10-CM

## 2013-03-08 DIAGNOSIS — I4891 Unspecified atrial fibrillation: Secondary | ICD-10-CM

## 2013-03-08 LAB — POCT INR: INR: 1.8

## 2013-03-22 ENCOUNTER — Encounter: Payer: Self-pay | Admitting: *Deleted

## 2013-03-22 DIAGNOSIS — C148 Malignant neoplasm of overlapping sites of lip, oral cavity and pharynx: Secondary | ICD-10-CM

## 2013-03-22 DIAGNOSIS — K0530 Chronic periodontitis, unspecified: Secondary | ICD-10-CM | POA: Insufficient documentation

## 2013-03-22 DIAGNOSIS — C760 Malignant neoplasm of head, face and neck: Secondary | ICD-10-CM

## 2013-03-22 DIAGNOSIS — E538 Deficiency of other specified B group vitamins: Secondary | ICD-10-CM

## 2013-03-22 DIAGNOSIS — K2100 Gastro-esophageal reflux disease with esophagitis, without bleeding: Secondary | ICD-10-CM | POA: Insufficient documentation

## 2013-03-22 DIAGNOSIS — I10 Essential (primary) hypertension: Secondary | ICD-10-CM | POA: Insufficient documentation

## 2013-03-22 DIAGNOSIS — J301 Allergic rhinitis due to pollen: Secondary | ICD-10-CM

## 2013-03-22 DIAGNOSIS — E782 Mixed hyperlipidemia: Secondary | ICD-10-CM | POA: Insufficient documentation

## 2013-03-22 DIAGNOSIS — M810 Age-related osteoporosis without current pathological fracture: Secondary | ICD-10-CM | POA: Insufficient documentation

## 2013-03-22 DIAGNOSIS — G319 Degenerative disease of nervous system, unspecified: Secondary | ICD-10-CM | POA: Insufficient documentation

## 2013-03-23 ENCOUNTER — Encounter: Payer: Self-pay | Admitting: Cardiovascular Disease

## 2013-03-23 ENCOUNTER — Ambulatory Visit (INDEPENDENT_AMBULATORY_CARE_PROVIDER_SITE_OTHER): Payer: Medicare Other | Admitting: Cardiovascular Disease

## 2013-03-23 VITALS — BP 154/78 | HR 64 | Ht 64.0 in | Wt 145.8 lb

## 2013-03-23 DIAGNOSIS — E782 Mixed hyperlipidemia: Secondary | ICD-10-CM

## 2013-03-23 DIAGNOSIS — I4891 Unspecified atrial fibrillation: Secondary | ICD-10-CM

## 2013-03-23 DIAGNOSIS — I10 Essential (primary) hypertension: Secondary | ICD-10-CM

## 2013-03-23 MED ORDER — LOSARTAN POTASSIUM 100 MG PO TABS: 100.0000 mg | ORAL_TABLET | Freq: Every day | ORAL | Status: AC

## 2013-03-23 NOTE — Progress Notes (Signed)
Patient ID: Jaclyn Schultz, female   DOB: 1934-02-12, 79 y.o.   MRN: [id number]    CARDIOLOGY CONSULT NOTE  Patient ID: Jaclyn Schultz MRN: [id number] DOB/AGE: [birth year] 79 y.o.  Primary Physician: Milana Obey, MD  Reason for Consultation: Atrial fibrillation  HPI:  [name] is a 79 y.o. Woman with a PMH significant for paroxysmal atrial fibrillation, HTN, and hypercholesterolemia. She had a hospital admission in March 2011 for non-cardiac chest pain, and underwent cardiac catheterization on 11-25-2009 which reportedly showed no evidence of obstructive CAD nor coronary vasospasm.  She denies chest pain and SOB. She very seldom experiences palpitations which last a few seconds. She denies lightheadedness, presyncope, and syncope.   Review of systems complete and found to be negative unless listed above in HPI  Past Medical History: reflux esophagitis, osteoporosis   No family history on file.  History   Social History  . Marital Status: Widowed    Spouse Name: N/A    Number of Children: N/A  . Years of Education: N/A   Occupational History  . Not on file.   Social History Main Topics  . Smoking status: Never Smoker   . Smokeless tobacco: Not on file  . Alcohol Use: No  . Drug Use: No  . Sexually Active:    Other Topics Concern  . Not on file   Social History Narrative  . No narrative on file     Physical exam Blood pressure 154/78, pulse 64, height 5\' 4"  (1.626 m), weight 145 lb 12 oz (66.112 kg). General: NAD Neck: No JVD, no thyromegaly or thyroid nodule.  Lungs: Clear to auscultation bilaterally with normal respiratory effort. CV: Nondisplaced PMI.  Heart regular S1/S2, no S3/S4, no murmur.  No peripheral edema.  No carotid bruit.  Normal pedal pulses.  Abdomen: Soft, nontender, no hepatosplenomegaly, no distention.  Skin: Intact without lesions or rashes.  Neurologic: Alert and oriented x 3.  Psych: Normal affect. Extremities: No clubbing or  cyanosis.  HEENT: Normal.   Labs:   Lab Results  Component Value Date   WBC 6.8 04/19/2011   HGB 14.6 04/19/2011   HCT 44.1 04/19/2011   MCV 90.4 04/19/2011   PLT 228 04/19/2011   No results found for this basename: NA, K, CL, CO2, BUN, CREATININE, CALCIUM, LABALBU, PROT, BILITOT, ALKPHOS, ALT, AST, GLUCOSE,  in the last 168 hours Lab Results  Component Value Date   CKTOTAL 45 11/25/2009   CKMB 0.6 11/25/2009   TROPONINI  Value: <0.01        NO INDICATION OF MYOCARDIAL INJURY. 11/25/2009    No results found for this basename: CHOL   No results found for this basename: HDL   No results found for this basename: LDLCALC   No results found for this basename: TRIG   No results found for this basename: CHOLHDL   No results found for this basename: LDLDIRECT       EKG: Sinus rhythm with sinus arrhythmia, rate 60 bpm, axis within normal limits, intervals within normal limits, no acute ST-T wave changes.    ASSESSMENT AND PLAN:  1. Atrial fibrillation: pt is in sinus rhythm and is doing well from this standpoint. 2. HTN: uncontrolled today. In order to avoid increases in left atrial pressure which could precipitate atrial fibrillation, I will increase her Losartan to 100 mg daily. 3. Hypercholesterolemia: these are within normal limits (labs reviewed).   Signed: Prentice Docker, M.D., F.A.C.C. 03/23/2013, 8:49 AM

## 2013-03-23 NOTE — Patient Instructions (Addendum)
Your physician recommends that you schedule a follow-up appointment in:ONE YEAR  Your physician has recommended you make the following change in your medication:   1) INCREASE LOSARTAN TO 100MG  ONCE DAILY

## 2013-03-26 ENCOUNTER — Encounter: Payer: Self-pay | Admitting: Cardiovascular Disease

## 2013-03-26 ENCOUNTER — Ambulatory Visit (INDEPENDENT_AMBULATORY_CARE_PROVIDER_SITE_OTHER): Payer: Medicare Other | Admitting: *Deleted

## 2013-03-26 DIAGNOSIS — I4891 Unspecified atrial fibrillation: Secondary | ICD-10-CM

## 2013-03-26 DIAGNOSIS — Z7901 Long term (current) use of anticoagulants: Secondary | ICD-10-CM

## 2013-04-23 ENCOUNTER — Encounter: Payer: Self-pay | Admitting: *Deleted

## 2013-05-10 ENCOUNTER — Ambulatory Visit (INDEPENDENT_AMBULATORY_CARE_PROVIDER_SITE_OTHER): Payer: Medicare Other | Admitting: *Deleted

## 2013-05-10 DIAGNOSIS — Z7901 Long term (current) use of anticoagulants: Secondary | ICD-10-CM

## 2013-05-10 DIAGNOSIS — I4891 Unspecified atrial fibrillation: Secondary | ICD-10-CM

## 2013-05-31 ENCOUNTER — Ambulatory Visit (INDEPENDENT_AMBULATORY_CARE_PROVIDER_SITE_OTHER): Payer: Medicare Other | Admitting: *Deleted

## 2013-05-31 DIAGNOSIS — Z7901 Long term (current) use of anticoagulants: Secondary | ICD-10-CM

## 2013-05-31 DIAGNOSIS — I4891 Unspecified atrial fibrillation: Secondary | ICD-10-CM

## 2013-06-08 ENCOUNTER — Other Ambulatory Visit: Payer: Self-pay | Admitting: Cardiovascular Disease

## 2013-06-14 ENCOUNTER — Ambulatory Visit (INDEPENDENT_AMBULATORY_CARE_PROVIDER_SITE_OTHER): Payer: Medicare Other | Admitting: *Deleted

## 2013-06-14 DIAGNOSIS — Z7901 Long term (current) use of anticoagulants: Secondary | ICD-10-CM

## 2013-06-14 DIAGNOSIS — I4891 Unspecified atrial fibrillation: Secondary | ICD-10-CM

## 2013-06-14 LAB — POCT INR: INR: 1.9

## 2013-06-28 ENCOUNTER — Ambulatory Visit (INDEPENDENT_AMBULATORY_CARE_PROVIDER_SITE_OTHER): Payer: Medicare Other | Admitting: *Deleted

## 2013-06-28 DIAGNOSIS — Z7901 Long term (current) use of anticoagulants: Secondary | ICD-10-CM

## 2013-06-28 DIAGNOSIS — I4891 Unspecified atrial fibrillation: Secondary | ICD-10-CM

## 2013-06-28 LAB — POCT INR: INR: 2.5

## 2013-07-11 ENCOUNTER — Emergency Department (HOSPITAL_COMMUNITY): Payer: Medicare Other

## 2013-07-11 ENCOUNTER — Observation Stay (HOSPITAL_COMMUNITY)
Admission: EM | Admit: 2013-07-11 | Discharge: 2013-07-12 | Disposition: A | Discharge status: Home / Self Care | Payer: Medicare Other | Attending: Family Medicine | Admitting: Family Medicine

## 2013-07-11 ENCOUNTER — Encounter (HOSPITAL_COMMUNITY): Payer: Self-pay | Admitting: Emergency Medicine

## 2013-07-11 DIAGNOSIS — G3289 Other specified degenerative disorders of nervous system in diseases classified elsewhere: Secondary | ICD-10-CM

## 2013-07-11 DIAGNOSIS — C760 Malignant neoplasm of head, face and neck: Secondary | ICD-10-CM

## 2013-07-11 DIAGNOSIS — I4891 Unspecified atrial fibrillation: Secondary | ICD-10-CM

## 2013-07-11 DIAGNOSIS — E782 Mixed hyperlipidemia: Secondary | ICD-10-CM

## 2013-07-11 DIAGNOSIS — M810 Age-related osteoporosis without current pathological fracture: Secondary | ICD-10-CM

## 2013-07-11 DIAGNOSIS — Z7901 Long term (current) use of anticoagulants: Secondary | ICD-10-CM

## 2013-07-11 DIAGNOSIS — J209 Acute bronchitis, unspecified: Principal | ICD-10-CM

## 2013-07-11 DIAGNOSIS — J301 Allergic rhinitis due to pollen: Secondary | ICD-10-CM

## 2013-07-11 DIAGNOSIS — G319 Degenerative disease of nervous system, unspecified: Secondary | ICD-10-CM

## 2013-07-11 DIAGNOSIS — K2100 Gastro-esophageal reflux disease with esophagitis, without bleeding: Secondary | ICD-10-CM

## 2013-07-11 DIAGNOSIS — J40 Bronchitis, not specified as acute or chronic: Secondary | ICD-10-CM

## 2013-07-11 DIAGNOSIS — E538 Deficiency of other specified B group vitamins: Secondary | ICD-10-CM

## 2013-07-11 DIAGNOSIS — I10 Essential (primary) hypertension: Secondary | ICD-10-CM

## 2013-07-11 DIAGNOSIS — C148 Malignant neoplasm of overlapping sites of lip, oral cavity and pharynx: Secondary | ICD-10-CM

## 2013-07-11 DIAGNOSIS — R0602 Shortness of breath: Secondary | ICD-10-CM

## 2013-07-11 DIAGNOSIS — K0530 Chronic periodontitis, unspecified: Secondary | ICD-10-CM

## 2013-07-11 LAB — CBC WITH DIFFERENTIAL/PLATELET
Hemoglobin: 13.8 g/dL (ref 12.0–15.0)
Lymphs Abs: 1.6 10*3/uL (ref 0.7–4.0)
Monocytes Relative: 11 % (ref 3–12)
Neutro Abs: 5.9 10*3/uL (ref 1.7–7.7)
Neutrophils Relative %: 68 % (ref 43–77)
RBC: 4.46 MIL/uL (ref 3.87–5.11)

## 2013-07-11 LAB — COMPREHENSIVE METABOLIC PANEL
Alkaline Phosphatase: 77 U/L (ref 39–117)
BUN: 25 mg/dL — ABNORMAL HIGH (ref 6–23)
CO2: 25 mEq/L (ref 19–32)
Chloride: 102 mEq/L (ref 96–112)
GFR calc Af Amer: 55 mL/min — ABNORMAL LOW (ref >90)
Glucose, Bld: 118 mg/dL — ABNORMAL HIGH (ref 70–99)
Potassium: 3.4 mEq/L — ABNORMAL LOW (ref 3.5–5.1)
Total Bilirubin: 0.4 mg/dL (ref 0.3–1.2)

## 2013-07-11 LAB — TROPONIN I: Troponin I: <0.3 ng/mL (ref <0.30)

## 2013-07-11 MED ORDER — IPRATROPIUM BROMIDE 0.02 % IN SOLN
0.5000 mg | Freq: Once | RESPIRATORY_TRACT | Status: AC
  Administered 2013-07-11: 0.5 mg via RESPIRATORY_TRACT
  Filled 2013-07-11: qty 2.5

## 2013-07-11 MED ORDER — ALBUTEROL SULFATE (5 MG/ML) 0.5% IN NEBU
5.0000 mg | INHALATION_SOLUTION | Freq: Once | RESPIRATORY_TRACT | Status: AC
Start: 2013-07-11 — End: 2013-07-11
  Administered 2013-07-11: 5 mg via RESPIRATORY_TRACT
  Filled 2013-07-11: qty 1

## 2013-07-11 NOTE — ED Provider Notes (Signed)
CSN: [id number]     Arrival date & time 07/11/13  2159 History  This chart was scribed for Glynn Octave, MD by Blanchard Kelch, ED Scribe. The patient was seen in room APA14/APA14. Patient's care was started at 10:32 PM.      Chief Complaint  Patient presents with  . Shortness of Breath  . Cough    The history is provided by the patient. No language interpreter was used.    HPI Comments: Jaclyn Schultz is a 79 y.o. female who presents to the Emergency Department complaining of constant, worsening shortness of breath that began a week ago. She has an associated productive cough that has turned into a dry cough. She was seen by Dr. Sudie Bailey at Urgent Care two days ago who diagnosed her with Bronchitis and gave her Cefdinir. She was screened for influenza on 11/10, which came back negative, but she has not received a flu vaccination this season. She returned to urgent care today for worsening shortness of breath, and they changed her antibiotic to Levaquin and recommended she come to the ED if the SOB worsened further. The SOB is worsened when she lies down. She denies any alleviating factors. She denies sore throat, fever, or chest pain. She has a past medical history of Atrial fibrillation, bone cancer, hypertension, and hyperlipidemia. She denies a history of stent placement or MI. She denies sick contacts or recent travel. She denies smoking.    Past Medical History  Diagnosis Date  . Vitamin B 12 deficiency   . Vertigo   . High cholesterol   . Hypertension   . 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 Mandible surgery    . 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 Appendectomy    . Abdominal hysterectomy    . Cervical disc surgery     History reviewed. No pertinent family history. History  Substance Use Topics  . Smoking status: Never Smoker   . Smokeless tobacco: Not on file  . Alcohol Use: No   OB History   Grav  Para Term Preterm Abortions TAB SAB Ect Mult Living                 Review of Systems A complete 10 system review of systems was obtained and all systems are negative except as noted in the HPI and PMH.    Allergies  Erythromycin base  Home Medications   Current Outpatient Rx  Name  Route  Sig  Dispense  Refill  . alendronate (FOSAMAX) 70 MG tablet   Oral   Take 70 mg by mouth every 7 (seven) days. Take with a full glass of water on an empty stomach. Take on Mondays         . cetirizine (ZYRTEC) 10 MG tablet   Oral   Take 10 mg by mouth daily.           . cholecalciferol (VITAMIN D) 1000 UNITS tablet   Oral   Take 1,000 Units by mouth daily.           . hydrochlorothiazide (HYDRODIURIL) 25 MG tablet   Oral   Take 12.5 mg by mouth daily.         Marland Kitchen HYDROcodone-acetaminophen (NORCO) 10-325 MG per tablet   Oral   Take 1 tablet by mouth 2 (two) times daily as needed. For pain         .  levofloxacin (LEVAQUIN) 500 MG tablet   Oral   Take 500 mg by mouth daily. 10 day course starting on 07/11/2013         . losartan (COZAAR) 100 MG tablet   Oral   Take 1 tablet (100 mg total) by mouth daily.   90 tablet   3   . meclizine (ANTIVERT) 25 MG tablet   Oral   Take 25 mg by mouth 3 (three) times daily as needed.         . metoprolol tartrate (LOPRESSOR) 25 MG tablet   Oral   Take 25 mg by mouth 2 (two) times daily.          . nitrofurantoin (MACRODANTIN) 100 MG capsule   Oral   Take 100 mg by mouth at bedtime.           . potassium chloride (KLOR-CON) 10 MEQ CR tablet   Oral   Take 10 mEq by mouth daily.          Marland Kitchen PROAIR HFA 108 (90 BASE) MCG/ACT inhaler   Inhalation   Inhale 1 puff into the lungs every 6 (six) hours as needed. For shortness of breath/wheezing         . simvastatin (ZOCOR) 40 MG tablet   Oral   Take 40 mg by mouth at bedtime.          . vitamin B-12 (CYANOCOBALAMIN) 1000 MCG tablet   Oral   Take 1,000 mcg by mouth  daily.           . vitamin C (ASCORBIC ACID) 500 MG tablet   Oral   Take 500 mg by mouth daily.           Marland Kitchen warfarin (COUMADIN) 5 MG tablet   Oral   Take 2.5-5 mg by mouth See admin instructions. Take one-half tablet (2.5mg  total) on Sundays. Take one tablet (5mg  total) on all other days.         . cefdinir (OMNICEF) 300 MG capsule   Oral   Take 1 capsule by mouth 2 (two) times daily. 10 day course starting on 07/09/2013         . guaiFENesin (MUCINEX) 600 MG 12 hr tablet   Oral   Take 600 mg by mouth 2 (two) times daily.          Triage Vitals: BP 122/87  Pulse 97  Temp(Src) 97.4 F (36.3 C) (Oral)  Resp 16  Ht 5\' 4"  (1.626 m)  Wt 141 lb 14.4 oz (64.365 kg)  BMI 24.34 kg/m2  SpO2 100%  Physical Exam  Nursing note and vitals reviewed. Constitutional: She is oriented to person, place, and time. She appears well-developed and well-nourished. No distress.  HENT:  Head: Normocephalic and atraumatic.  Eyes: EOM are normal.  Neck: Neck supple. No tracheal deviation present.  Cardiovascular: Normal rate.   Pulmonary/Chest: Breath sounds normal. No respiratory distress. She has no wheezes. She has no rales.  Mildly increased work of breathing with dry cough.   Musculoskeletal: Normal range of motion.  No peripheral edema.   Neurological: She is alert and oriented to person, place, and time.  Skin: Skin is warm and dry.  Psychiatric: She has a normal mood and affect. Her behavior is normal.    ED Course  Procedures (including critical care time)  DIAGNOSTIC STUDIES: Oxygen Saturation is 100% on room air, normal by my interpretation.    COORDINATION OF CARE: 10:46 PM -Will order chest x-ray and  breathing treatment. Patient verbalizes understanding and agrees with treatment plan.    Labs Review Labs Reviewed  COMPREHENSIVE METABOLIC PANEL - Abnormal; Notable for the following:    Potassium 3.4 (*)    Glucose, Bld 118 (*)    BUN 25 (*)    Albumin 3.3 (*)     GFR calc non Af Amer 47 (*)    GFR calc Af Amer 55 (*)    All other components within normal limits  PROTIME-INR - Abnormal; Notable for the following:    Prothrombin Time 17.9 (*)    INR 1.52 (*)    All other components within normal limits  CBC WITH DIFFERENTIAL  TROPONIN I   Imaging Review Dg Chest 2 View  07/11/2013   CLINICAL DATA:  Shortness of breath and cough for 3 days.  EXAM: CHEST  2 VIEW  COMPARISON:  Chest radiograph performed 07/09/2013  FINDINGS: The lungs are well-aerated. There is no evidence of focal opacification, pleural effusion or pneumothorax. Mild chronic peribronchial thickening is noted.  The heart is normal in size; the mediastinal contour is within normal limits. No acute osseous abnormalities are seen. Cervical spinal fusion hardware is noted. Clips are noted about the thyroid bed.  IMPRESSION: No acute cardiopulmonary process seen; mild chronic peribronchial thickening noted.   Electronically Signed   By: Roanna Raider M.D.   On: 07/11/2013 23:34   Ct Angio Chest Pe W/cm &/or Wo Cm  07/12/2013   CLINICAL DATA:  Shortness of breath. History of head and neck cancer. Patient is anticoagulated.  EXAM: CT ANGIOGRAPHY CHEST WITH CONTRAST  TECHNIQUE: Multidetector CT imaging of the chest was performed using the standard protocol during bolus administration of intravenous contrast. Multiplanar CT image reconstructions including MIPs were obtained to evaluate the vascular anatomy.  CONTRAST:  OMNIPAQUE IOHEXOL 350 MG/ML SOLN  COMPARISON:  Chest radiograph July 11, 2013.  FINDINGS: Main pulmonary artery is not enlarged. No pulmonary arterial filling defects to the level of the subsegmental branches.  No pulmonary nodules, masses, focal consolidations nor pleural effusions. Tracheobronchial tree is patent and midline.  Heart and pericardium are nonsuspicious. Common origin of the brachiocephalic artery and left common carotid artery common normal variant.  Calcification at the origin left vertebral artery could mildly narrow the vessel. Thoracic aorta is normal in course and caliber with mild calcific atherosclerosis at the arch. No lymphadenopathy by CT size criteria.  Thoracic esophagus is nonsuspicious, small hiatal hernia. Included view of the abdomen is nonsuspicious, hepatic granuloma. Surgical clips within the right anterior neck strap muscles, with normal appearance of the included thyroid gland. Patient is mildly osteopenic with mild degenerative change of the thoracic spine.  Review of the MIP images confirms the above findings.  IMPRESSION: No pulmonary embolism nor acute cardiopulmonary process.   Electronically Signed   By: Awilda Metro   On: 07/12/2013 01:12    EKG Interpretation     Ventricular Rate:  85 PR Interval:  142 QRS Duration: 88 QT Interval:  380 QTC Calculation: 452 R Axis:   -29 Text Interpretation:  Normal sinus rhythm Nonspecific ST abnormality Abnormal ECG When compared with ECG of 19-Apr-2011 08:40, anterior lateral ST depression            MDM   1. Bronchitis    2 week history of SOB with cough.  Recent diagnosis of pneumonia, worsening SOB. No hypoxia. EKG with subtle new lateral depressions.  Patient endorses some chest pain with coughing. But no description  of typical chest pain. Mild tachypnea, speaking in full sentences.  Lungs clear.    With clear CXR and tachypnea, will check CTPE.  CTPE negative.  Patient however remains tachypneic, no hypoxia.  Able to ambulate without desaturation. Levaquin started for presumed bronchitis. With increased work of breathing and EKG changes, will admit.     I personally performed the services described in this documentation, which was scribed in my presence. The recorded information has been reviewed and is accurate.    Glynn Octave, MD 07/12/13 [phone]

## 2013-07-11 NOTE — ED Notes (Signed)
Respiratory in to complete nebulizer.

## 2013-07-11 NOTE — ED Notes (Addendum)
Patient states she was seen at Urgent Care on Monday and was given antibiotics. States Dr. Sudie Bailey told her today that she had pneumonia and recommended she come to the ED if she was short of breath. Patient complaining of severe cough and shortness of breath. Also complaining of chest pain with cough.

## 2013-07-12 ENCOUNTER — Encounter (HOSPITAL_COMMUNITY): Payer: Self-pay | Admitting: *Deleted

## 2013-07-12 DIAGNOSIS — I4891 Unspecified atrial fibrillation: Secondary | ICD-10-CM

## 2013-07-12 DIAGNOSIS — J209 Acute bronchitis, unspecified: Secondary | ICD-10-CM | POA: Diagnosis present

## 2013-07-12 DIAGNOSIS — I10 Essential (primary) hypertension: Secondary | ICD-10-CM

## 2013-07-12 DIAGNOSIS — Z7901 Long term (current) use of anticoagulants: Secondary | ICD-10-CM

## 2013-07-12 DIAGNOSIS — R0602 Shortness of breath: Secondary | ICD-10-CM | POA: Diagnosis present

## 2013-07-12 LAB — CBC
Hemoglobin: 13.8 g/dL (ref 12.0–15.0)
MCH: 30.9 pg (ref 26.0–34.0)
MCHC: 34.9 g/dL (ref 30.0–36.0)
RDW: 12.8 % (ref 11.5–15.5)
WBC: 6.6 10*3/uL (ref 4.0–10.5)

## 2013-07-12 LAB — BASIC METABOLIC PANEL
BUN: 21 mg/dL (ref 6–23)
Calcium: 10.1 mg/dL (ref 8.4–10.5)
Chloride: 101 mEq/L (ref 96–112)
Creatinine, Ser: 1.12 mg/dL — ABNORMAL HIGH (ref 0.50–1.10)
GFR calc Af Amer: 53 mL/min — ABNORMAL LOW (ref >90)
GFR calc non Af Amer: 46 mL/min — ABNORMAL LOW (ref >90)
Potassium: 3.3 mEq/L — ABNORMAL LOW (ref 3.5–5.1)

## 2013-07-12 MED ORDER — ALBUTEROL SULFATE (5 MG/ML) 0.5% IN NEBU: 2.5000 mg | INHALATION_SOLUTION | RESPIRATORY_TRACT | Status: DC | PRN

## 2013-07-12 MED ORDER — VITAMIN C 500 MG PO TABS: 500.0000 mg | ORAL_TABLET | Freq: Every day | ORAL | Status: DC

## 2013-07-12 MED ORDER — SODIUM CHLORIDE 0.9 % IJ SOLN
3.0000 mL | Freq: Two times a day (BID) | INTRAMUSCULAR | Status: DC
  Administered 2013-07-12: 3 mL via INTRAVENOUS

## 2013-07-12 MED ORDER — LEVOFLOXACIN 500 MG PO TABS: 500.0000 mg | ORAL_TABLET | Freq: Every day | ORAL | Status: DC

## 2013-07-12 MED ORDER — ALBUTEROL SULFATE HFA 108 (90 BASE) MCG/ACT IN AERS: 1.0000 | INHALATION_SPRAY | Freq: Four times a day (QID) | RESPIRATORY_TRACT | Status: DC | PRN

## 2013-07-12 MED ORDER — METHYLPREDNISOLONE SODIUM SUCC 125 MG IJ SOLR
125.0000 mg | Freq: Once | INTRAMUSCULAR | Status: AC
  Administered 2013-07-12: 125 mg via INTRAVENOUS
  Filled 2013-07-12: qty 2

## 2013-07-12 MED ORDER — ONDANSETRON HCL 4 MG PO TABS: 4.0000 mg | ORAL_TABLET | Freq: Four times a day (QID) | ORAL | Status: DC | PRN

## 2013-07-12 MED ORDER — HYDROCHLOROTHIAZIDE 25 MG PO TABS
12.5000 mg | ORAL_TABLET | Freq: Every day | ORAL | Status: DC
  Administered 2013-07-12: 12.5 mg via ORAL
  Filled 2013-07-12: qty 1

## 2013-07-12 MED ORDER — IOHEXOL 350 MG/ML SOLN
100.0000 mL | Freq: Once | INTRAVENOUS | Status: AC | PRN
  Administered 2013-07-12: 100 mL via INTRAVENOUS

## 2013-07-12 MED ORDER — WARFARIN SODIUM 2.5 MG PO TABS
5.0000 mg | ORAL_TABLET | ORAL | Status: DC
  Administered 2013-07-12: 5 mg via ORAL
  Filled 2013-07-12: qty 2

## 2013-07-12 MED ORDER — WARFARIN - PHYSICIAN DOSING INPATIENT: Freq: Every day | Status: DC

## 2013-07-12 MED ORDER — GUAIFENESIN ER 600 MG PO TB12
600.0000 mg | ORAL_TABLET | Freq: Two times a day (BID) | ORAL | Status: DC
  Administered 2013-07-12 (×2): 600 mg via ORAL
  Filled 2013-07-12 (×2): qty 1

## 2013-07-12 MED ORDER — PREDNISONE 20 MG PO TABS: ORAL_TABLET | ORAL | Status: DC

## 2013-07-12 MED ORDER — ONDANSETRON HCL 4 MG/2ML IJ SOLN: 4.0000 mg | Freq: Four times a day (QID) | INTRAMUSCULAR | Status: DC | PRN

## 2013-07-12 MED ORDER — LEVOFLOXACIN IN D5W 500 MG/100ML IV SOLN
500.0000 mg | Freq: Once | INTRAVENOUS | Status: AC
  Administered 2013-07-12: 500 mg via INTRAVENOUS
  Filled 2013-07-12: qty 100

## 2013-07-12 MED ORDER — SODIUM CHLORIDE 0.9 % IJ SOLN: 3.0000 mL | INTRAMUSCULAR | Status: DC | PRN

## 2013-07-12 MED ORDER — WARFARIN SODIUM 2.5 MG PO TABS: 2.5000 mg | ORAL_TABLET | ORAL | Status: DC

## 2013-07-12 MED ORDER — GUAIFENESIN-DM 100-10 MG/5ML PO SYRP: 5.0000 mL | ORAL_SOLUTION | ORAL | Status: DC | PRN

## 2013-07-12 MED ORDER — SODIUM CHLORIDE 0.9 % IV SOLN: 250.0000 mL | INTRAVENOUS | Status: DC | PRN

## 2013-07-12 MED ORDER — METOPROLOL TARTRATE 25 MG PO TABS
25.0000 mg | ORAL_TABLET | Freq: Two times a day (BID) | ORAL | Status: DC
  Administered 2013-07-12 (×2): 25 mg via ORAL
  Filled 2013-07-12 (×2): qty 1

## 2013-07-12 NOTE — Progress Notes (Signed)
Utilization review completed.  

## 2013-07-12 NOTE — ED Notes (Signed)
Patient ambulated to bathroom with assist

## 2013-07-12 NOTE — ED Notes (Signed)
Patient ambulated in hallway with pulse ox.  Patient with dry cough.  Maintained O2 sats of 100%; however became tachycardic at 110 while walking.

## 2013-07-12 NOTE — Discharge Summary (Signed)
Physician Discharge Summary  Patient ID: Jaclyn Schultz MRN: [id number] DOB/AGE: October 06, 1933 79 y.o. Primary Care Physician:KNOWLTON,STEPHEN D, MD Admit date: 07/11/2013 Discharge date: 07/12/2013    Discharge Diagnoses:  1. Acute bronchitis, improving. 2. Hypertension, controlled. 3. Long-term use of anticoagulants.     Medication List    STOP taking these medications       cefdinir 300 MG capsule  Commonly known as:  OMNICEF      TAKE these medications       alendronate 70 MG tablet  Commonly known as:  FOSAMAX  Take 70 mg by mouth every 7 (seven) days. Take with a full glass of water on an empty stomach. Take on Mondays     cetirizine 10 MG tablet  Commonly known as:  ZYRTEC  Take 10 mg by mouth daily.     cholecalciferol 1000 UNITS tablet  Commonly known as:  VITAMIN D  Take 1,000 Units by mouth daily.     guaiFENesin 600 MG 12 hr tablet  Commonly known as:  MUCINEX  Take 600 mg by mouth 2 (two) times daily.     hydrochlorothiazide 25 MG tablet  Commonly known as:  HYDRODIURIL  Take 12.5 mg by mouth daily.     HYDROcodone-acetaminophen 10-325 MG per tablet  Commonly known as:  NORCO  Take 1 tablet by mouth 2 (two) times daily as needed. For pain     levofloxacin 500 MG tablet  Commonly known as:  LEVAQUIN  Take 500 mg by mouth daily. 10 day course starting on 07/11/2013     losartan 100 MG tablet  Commonly known as:  COZAAR  Take 1 tablet (100 mg total) by mouth daily.     meclizine 25 MG tablet  Commonly known as:  ANTIVERT  Take 25 mg by mouth 3 (three) times daily as needed.     metoprolol tartrate 25 MG tablet  Commonly known as:  LOPRESSOR  Take 25 mg by mouth 2 (two) times daily.     nitrofurantoin 100 MG capsule  Commonly known as:  MACRODANTIN  Take 100 mg by mouth at bedtime.     potassium chloride 10 MEQ CR tablet  Commonly known as:  KLOR-CON  Take 10 mEq by mouth daily.     predniSONE 20 MG tablet  Commonly known as:   DELTASONE  Take 2 tablets every morning for 3 days, then one tablet every morning for 3 days, then half tablet every morning for 3 days, then STOP.     PROAIR HFA 108 (90 BASE) MCG/ACT inhaler  Generic drug:  albuterol  Inhale 1 puff into the lungs every 6 (six) hours as needed. For shortness of breath/wheezing     simvastatin 40 MG tablet  Commonly known as:  ZOCOR  Take 40 mg by mouth at bedtime.     vitamin B-12 1000 MCG tablet  Commonly known as:  CYANOCOBALAMIN  Take 1,000 mcg by mouth daily.     vitamin C 500 MG tablet  Commonly known as:  ASCORBIC ACID  Take 500 mg by mouth daily.     warfarin 5 MG tablet  Commonly known as:  COUMADIN  Take 2.5-5 mg by mouth See admin instructions. Take one-half tablet (2.5mg  total) on Sundays. Take one tablet (5mg  total) on all other days.        Discharged Condition: Stable and improving.    Consults: None.  Significant Diagnostic Studies: Dg Chest 2 View  07/11/2013   CLINICAL DATA:  Shortness of breath and cough for 3 days.  EXAM: CHEST  2 VIEW  COMPARISON:  Chest radiograph performed 07/09/2013  FINDINGS: The lungs are well-aerated. There is no evidence of focal opacification, pleural effusion or pneumothorax. Mild chronic peribronchial thickening is noted.  The heart is normal in size; the mediastinal contour is within normal limits. No acute osseous abnormalities are seen. Cervical spinal fusion hardware is noted. Clips are noted about the thyroid bed.  IMPRESSION: No acute cardiopulmonary process seen; mild chronic peribronchial thickening noted.   Electronically Signed   By: Roanna Raider M.D.   On: 07/11/2013 23:34   Ct Angio Chest Pe W/cm &/or Wo Cm  07/12/2013   CLINICAL DATA:  Shortness of breath. History of head and neck cancer. Patient is anticoagulated.  EXAM: CT ANGIOGRAPHY CHEST WITH CONTRAST  TECHNIQUE: Multidetector CT imaging of the chest was performed using the standard protocol during bolus administration of  intravenous contrast. Multiplanar CT image reconstructions including MIPs were obtained to evaluate the vascular anatomy.  CONTRAST:  OMNIPAQUE IOHEXOL 350 MG/ML SOLN  COMPARISON:  Chest radiograph July 11, 2013.  FINDINGS: Main pulmonary artery is not enlarged. No pulmonary arterial filling defects to the level of the subsegmental branches.  No pulmonary nodules, masses, focal consolidations nor pleural effusions. Tracheobronchial tree is patent and midline.  Heart and pericardium are nonsuspicious. Common origin of the brachiocephalic artery and left common carotid artery common normal variant. Calcification at the origin left vertebral artery could mildly narrow the vessel. Thoracic aorta is normal in course and caliber with mild calcific atherosclerosis at the arch. No lymphadenopathy by CT size criteria.  Thoracic esophagus is nonsuspicious, small hiatal hernia. Included view of the abdomen is nonsuspicious, hepatic granuloma. Surgical clips within the right anterior neck strap muscles, with normal appearance of the included thyroid gland. Patient is mildly osteopenic with mild degenerative change of the thoracic spine.  Review of the MIP images confirms the above findings.  IMPRESSION: No pulmonary embolism nor acute cardiopulmonary process.   Electronically Signed   By: Awilda Metro   On: 07/12/2013 01:12    Lab Results: Basic Metabolic Panel:  Recent Labs  40/98/11 2253 07/12/13 0527  NA 140 139  K 3.4* 3.3*  CL 102 101  CO2 25 22  GLUCOSE 118* 135*  BUN 25* 21  CREATININE 1.10 1.12*  CALCIUM 10.0 10.1   Liver Function Tests:  Recent Labs  07/11/13 2253  AST 20  ALT 15  ALKPHOS 77  BILITOT 0.4  PROT 7.2  ALBUMIN 3.3*     CBC:  Recent Labs  07/11/13 2253 07/12/13 0527  WBC 8.6 6.6  NEUTROABS 5.9  --   HGB 13.8 13.8  HCT 39.6 39.5  MCV 88.8 88.6  PLT 235 249       Hospital Course: This 79 year old lady presented to the hospital with cough and  dyspnea. Please see initial history as outlined below: HPI:  79 yo female with 3-4 days of nasal congestion, cough, feels like she is wheezing and sob. Has seen urgent care and was placed on omnicef. Then f/u with her pcp today who switched her to levaquin. She denies having any fevers. No n/v/d. nonprod cough. No cp. Just feels awful. No history of asthma or tobacco use. She has done well overnight and she feels much improved, back to her baseline. She has antibiotics that were given to her in the outpatient setting and she will continue and finish this course. I have  added a tapering course of prednisone which she will also take. She will followup in the office in a couple weeks.  Discharge Exam: Blood pressure 140/59, pulse 95, temperature 98 F (36.7 C), temperature source Oral, resp. rate 20, height 5\' 4"  (1.626 m), weight 65.1 kg (143 lb 8.3 oz), SpO2 100.00%. 2 look systemically well this morning. There is no increased work of breathing. There is no peripheral central cyanosis. Lung fields show some scattered wheezing but her chest is not tight. There are no crackles or bronchial breathing. Heart sounds are present without gallop rhythm. She is clinically not in heart failure. She is alert and orientated.  Disposition: Home.      Discharge Orders   Future Appointments Provider Department Dept Phone   07/19/2013 9:40 AM [facility] [place] [phone]   Future Orders Complete By Expires   Diet - low sodium heart healthy  As directed    Increase activity slowly  As directed       Follow-up Information   Follow up with Milana Obey, MD. Schedule an appointment as soon as possible for a visit in 2 weeks.   Specialty:  Family Medicine   Contact information:   [address] [place] [address] [phone]       Signed: Wilson Singer Pager [phone]  07/12/2013, 8:42 AM

## 2013-07-12 NOTE — H&P (Signed)
PCP:   Milana Obey, MD   Chief Complaint:  cough  HPI: 79 yo female with 3-4 days of nasal congestion, cough, feels like she is wheezing and sob.  Has seen urgent care and was placed on omnicef.  Then f/u with her pcp today who switched her to levaquin.  She denies having any fevers.  No n/v/d.  nonprod cough.  No cp.  Just feels awful.  No history of asthma or tobacco use.    Review of Systems:  Positive and negative as per HPI otherwise all other systems are negative  Past Medical History: Past Medical History  Diagnosis Date  . Vitamin B 12 deficiency   . Vertigo   . High cholesterol   . Hypertension   . 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 Mandible surgery    . 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 Appendectomy    . Abdominal hysterectomy    . Cervical disc surgery      Medications: Prior to Admission medications   Medication Sig Start Date End Date Taking? Authorizing Provider  alendronate (FOSAMAX) 70 MG tablet Take 70 mg by mouth every 7 (seven) days. Take with a full glass of water on an empty stomach. Take on Mondays   Yes Historical Provider, MD  cetirizine (ZYRTEC) 10 MG tablet Take 10 mg by mouth daily.     Yes Historical Provider, MD  cholecalciferol (VITAMIN D) 1000 UNITS tablet Take 1,000 Units by mouth daily.     Yes Historical Provider, MD  hydrochlorothiazide (HYDRODIURIL) 25 MG tablet Take 12.5 mg by mouth daily.   Yes Historical Provider, MD  HYDROcodone-acetaminophen (NORCO) 10-325 MG per tablet Take 1 tablet by mouth 2 (two) times daily as needed. For pain 06/20/13  Yes Historical Provider, MD  levofloxacin (LEVAQUIN) 500 MG tablet Take 500 mg by mouth daily. 10 day course starting on 07/11/2013 07/11/13  Yes Historical Provider, MD  losartan (COZAAR) 100 MG tablet Take 1 tablet (100 mg total) by mouth daily. 03/23/13  Yes Laqueta Linden, MD  meclizine (ANTIVERT) 25 MG tablet  Take 25 mg by mouth 3 (three) times daily as needed.   Yes Historical Provider, MD  metoprolol tartrate (LOPRESSOR) 25 MG tablet Take 25 mg by mouth 2 (two) times daily.    Yes Historical Provider, MD  nitrofurantoin (MACRODANTIN) 100 MG capsule Take 100 mg by mouth at bedtime.     Yes Historical Provider, MD  potassium chloride (KLOR-CON) 10 MEQ CR tablet Take 10 mEq by mouth daily.    Yes Historical Provider, MD  PROAIR HFA 108 (90 BASE) MCG/ACT inhaler Inhale 1 puff into the lungs every 6 (six) hours as needed. For shortness of breath/wheezing 07/11/13  Yes Historical Provider, MD  simvastatin (ZOCOR) 40 MG tablet Take 40 mg by mouth at bedtime.    Yes Historical Provider, MD  vitamin B-12 (CYANOCOBALAMIN) 1000 MCG tablet Take 1,000 mcg by mouth daily.     Yes Historical Provider, MD  vitamin C (ASCORBIC ACID) 500 MG tablet Take 500 mg by mouth daily.     Yes Historical Provider, MD  warfarin (COUMADIN) 5 MG tablet Take 2.5-5 mg by mouth See admin instructions. Take one-half tablet (2.5mg  total) on Sundays. Take one tablet (5mg  total) on all other days.   Yes Historical Provider, MD  cefdinir (OMNICEF) 300 MG capsule Take 1 capsule by mouth 2 (  two) times daily. 10 day course starting on 07/09/2013 07/09/13   Historical Provider, MD  guaiFENesin (MUCINEX) 600 MG 12 hr tablet Take 600 mg by mouth 2 (two) times daily.    Historical Provider, MD    Allergies:   Allergies  Allergen Reactions  . Erythromycin Base Rash    Social History:  reports that she has never smoked. She does not have any smokeless tobacco history on file. She reports that she does not drink alcohol or use illicit drugs.  Family History: History reviewed. No pertinent family history.  Physical Exam: Filed Vitals:   07/11/13 2218 07/11/13 2219  BP: 122/87   Pulse: 97   Temp: 97.4 F (36.3 C)   TempSrc: Oral   Resp: 16   Height:  5\' 4"  (1.626 m)  Weight:  64.365 kg (141 lb 14.4 oz)  SpO2: 100%    General  appearance: alert, cooperative and no distress Head: Normocephalic, without obvious abnormality, atraumatic Eyes: negative Nose: Nares normal. Septum midline. Mucosa normal. No drainage or sinus tenderness. Neck: no JVD and supple, symmetrical, trachea midline Lungs: clear to auscultation bilaterally Heart: regular rate and rhythm, S1, S2 normal, no murmur, click, rub or gallop Abdomen: soft, non-tender; bowel sounds normal; no masses,  no organomegaly Extremities: extremities normal, atraumatic, no cyanosis or edema Pulses: 2+ and symmetric Skin: Skin color, texture, turgor normal. No rashes or lesions Neurologic: Grossly normal   Labs on Admission:   Recent Labs  07/11/13 2253  NA 140  K 3.4*  CL 102  CO2 25  GLUCOSE 118*  BUN 25*  CREATININE 1.10  CALCIUM 10.0    Recent Labs  07/11/13 2253  AST 20  ALT 15  ALKPHOS 77  BILITOT 0.4  PROT 7.2  ALBUMIN 3.3*    Recent Labs  07/11/13 2253  WBC 8.6  NEUTROABS 5.9  HGB 13.8  HCT 39.6  MCV 88.8  PLT 235    Recent Labs  07/11/13 2253  TROPONINI <0.30   Radiological Exams on Admission: Dg Chest 2 View  07/11/2013   CLINICAL DATA:  Shortness of breath and cough for 3 days.  EXAM: CHEST  2 VIEW  COMPARISON:  Chest radiograph performed 07/09/2013  FINDINGS: The lungs are well-aerated. There is no evidence of focal opacification, pleural effusion or pneumothorax. Mild chronic peribronchial thickening is noted.  The heart is normal in size; the mediastinal contour is within normal limits. No acute osseous abnormalities are seen. Cervical spinal fusion hardware is noted. Clips are noted about the thyroid bed.  IMPRESSION: No acute cardiopulmonary process seen; mild chronic peribronchial thickening noted.   Electronically Signed   By: Roanna Raider M.D.   On: 07/11/2013 23:34   Ct Angio Chest Pe W/cm &/or Wo Cm  07/12/2013   CLINICAL DATA:  Shortness of breath. History of head and neck cancer. Patient is  anticoagulated.  EXAM: CT ANGIOGRAPHY CHEST WITH CONTRAST  TECHNIQUE: Multidetector CT imaging of the chest was performed using the standard protocol during bolus administration of intravenous contrast. Multiplanar CT image reconstructions including MIPs were obtained to evaluate the vascular anatomy.  CONTRAST:  OMNIPAQUE IOHEXOL 350 MG/ML SOLN  COMPARISON:  Chest radiograph July 11, 2013.  FINDINGS: Main pulmonary artery is not enlarged. No pulmonary arterial filling defects to the level of the subsegmental branches.  No pulmonary nodules, masses, focal consolidations nor pleural effusions. Tracheobronchial tree is patent and midline.  Heart and pericardium are nonsuspicious. Common origin of the brachiocephalic artery and  left common carotid artery common normal variant. Calcification at the origin left vertebral artery could mildly narrow the vessel. Thoracic aorta is normal in course and caliber with mild calcific atherosclerosis at the arch. No lymphadenopathy by CT size criteria.  Thoracic esophagus is nonsuspicious, small hiatal hernia. Included view of the abdomen is nonsuspicious, hepatic granuloma. Surgical clips within the right anterior neck strap muscles, with normal appearance of the included thyroid gland. Patient is mildly osteopenic with mild degenerative change of the thoracic spine.  Review of the MIP images confirms the above findings.  IMPRESSION: No pulmonary embolism nor acute cardiopulmonary process.   Electronically Signed   By: Awilda Metro   On: 07/12/2013 01:12    Assessment/Plan 79 yo female with acute bronchitis likely due to virus  Principal Problem:   Bronchitis, acute-  Continue symptomatic treatment.  Cont levaquin which was started today.  May benefit from steroids, received a dose in ED although no wheezing on exam and but reports that she feels like she is wheezing.  cxr no infiltrate.  obs overnight.  Oxygen sats are 100% on RA, vitals otherwise normal  also.    Active Problems:   Long term (current) use of anticoagulants   Malignant neoplasm of head, face, and neck   Essential hypertension, benign   SOB (shortness of breath)  pcp is dr Sudie Bailey.    Harlen Danford A 07/12/2013, 1:40 AM

## 2013-07-12 NOTE — ED Notes (Signed)
Patient returned to room with assist from family.

## 2013-07-12 NOTE — Progress Notes (Signed)
Late Entry:  AVS reviewed with patient.  Prescription provided to patient.  Patient verbalized understanding of discharge instructions, physician follow-up and medications.  Patient's IV removed.  Site WNL.  Patient reports all belongings intact and in possession at time of discharge.  Patient transported by NT via w/c to main entrance for discharge.  Patient stable at time of discharge.

## 2013-07-19 ENCOUNTER — Ambulatory Visit (INDEPENDENT_AMBULATORY_CARE_PROVIDER_SITE_OTHER): Payer: Medicare Other | Admitting: *Deleted

## 2013-07-19 DIAGNOSIS — Z7901 Long term (current) use of anticoagulants: Secondary | ICD-10-CM

## 2013-07-19 DIAGNOSIS — I4891 Unspecified atrial fibrillation: Secondary | ICD-10-CM

## 2013-07-19 LAB — POCT INR: INR: 5.7

## 2013-07-23 ENCOUNTER — Encounter: Payer: Self-pay | Admitting: *Deleted

## 2013-08-01 ENCOUNTER — Ambulatory Visit (INDEPENDENT_AMBULATORY_CARE_PROVIDER_SITE_OTHER): Payer: Medicare Other | Admitting: *Deleted

## 2013-08-01 DIAGNOSIS — Z7901 Long term (current) use of anticoagulants: Secondary | ICD-10-CM

## 2013-08-01 DIAGNOSIS — I4891 Unspecified atrial fibrillation: Secondary | ICD-10-CM

## 2013-08-01 LAB — POCT INR: INR: 1

## 2013-08-13 ENCOUNTER — Emergency Department (HOSPITAL_COMMUNITY)
Admission: EM | Admit: 2013-08-13 | Discharge: 2013-08-13 | Disposition: A | Discharge status: Home / Self Care | Payer: Medicare Other | Attending: Emergency Medicine | Admitting: Emergency Medicine

## 2013-08-13 ENCOUNTER — Ambulatory Visit (INDEPENDENT_AMBULATORY_CARE_PROVIDER_SITE_OTHER): Payer: Medicare Other | Admitting: *Deleted

## 2013-08-13 ENCOUNTER — Encounter (HOSPITAL_COMMUNITY): Payer: Self-pay | Admitting: Emergency Medicine

## 2013-08-13 ENCOUNTER — Emergency Department (HOSPITAL_COMMUNITY): Payer: Medicare Other

## 2013-08-13 DIAGNOSIS — Y9389 Activity, other specified: Secondary | ICD-10-CM | POA: Insufficient documentation

## 2013-08-13 DIAGNOSIS — Z792 Long term (current) use of antibiotics: Secondary | ICD-10-CM | POA: Insufficient documentation

## 2013-08-13 DIAGNOSIS — X500XXA Overexertion from strenuous movement or load, initial encounter: Secondary | ICD-10-CM | POA: Insufficient documentation

## 2013-08-13 DIAGNOSIS — Z7901 Long term (current) use of anticoagulants: Secondary | ICD-10-CM | POA: Insufficient documentation

## 2013-08-13 DIAGNOSIS — Z79899 Other long term (current) drug therapy: Secondary | ICD-10-CM | POA: Insufficient documentation

## 2013-08-13 DIAGNOSIS — E7800 Pure hypercholesterolemia, unspecified: Secondary | ICD-10-CM | POA: Insufficient documentation

## 2013-08-13 DIAGNOSIS — R609 Edema, unspecified: Secondary | ICD-10-CM | POA: Insufficient documentation

## 2013-08-13 DIAGNOSIS — IMO0002 Reserved for concepts with insufficient information to code with codable children: Secondary | ICD-10-CM | POA: Insufficient documentation

## 2013-08-13 DIAGNOSIS — S92302A Fracture of unspecified metatarsal bone(s), left foot, initial encounter for closed fracture: Secondary | ICD-10-CM

## 2013-08-13 DIAGNOSIS — I4891 Unspecified atrial fibrillation: Secondary | ICD-10-CM

## 2013-08-13 DIAGNOSIS — Z85819 Personal history of malignant neoplasm of unspecified site of lip, oral cavity, and pharynx: Secondary | ICD-10-CM | POA: Insufficient documentation

## 2013-08-13 DIAGNOSIS — I10 Essential (primary) hypertension: Secondary | ICD-10-CM | POA: Insufficient documentation

## 2013-08-13 DIAGNOSIS — Y929 Unspecified place or not applicable: Secondary | ICD-10-CM | POA: Insufficient documentation

## 2013-08-13 DIAGNOSIS — S92309A Fracture of unspecified metatarsal bone(s), unspecified foot, initial encounter for closed fracture: Secondary | ICD-10-CM | POA: Insufficient documentation

## 2013-08-13 LAB — POCT INR: INR: 3.7

## 2013-08-13 MED ORDER — HYDROCODONE-ACETAMINOPHEN 5-325 MG PO TABS: ORAL_TABLET | ORAL | Status: DC

## 2013-08-13 MED ORDER — HYDROCODONE-ACETAMINOPHEN 5-325 MG PO TABS
1.0000 | ORAL_TABLET | Freq: Once | ORAL | Status: AC
  Administered 2013-08-13: 1 via ORAL
  Filled 2013-08-13: qty 1

## 2013-08-13 NOTE — ED Notes (Signed)
Pt got up off couch and got foot caught up and heard a pop, swelling and bruising noted to left foot

## 2013-08-13 NOTE — ED Notes (Signed)
Cam walker applied to left foot.  Patient given instructions on care and wearing boot.  Patient verbalized understanding.

## 2013-08-13 NOTE — ED Notes (Signed)
See PA assessment 

## 2013-08-14 ENCOUNTER — Ambulatory Visit (INDEPENDENT_AMBULATORY_CARE_PROVIDER_SITE_OTHER): Payer: Medicare Other | Admitting: Orthopedic Surgery

## 2013-08-14 ENCOUNTER — Encounter: Payer: Self-pay | Admitting: Orthopedic Surgery

## 2013-08-14 VITALS — BP 131/74 | Ht 64.0 in | Wt 144.0 lb

## 2013-08-14 DIAGNOSIS — S92302A Fracture of unspecified metatarsal bone(s), left foot, initial encounter for closed fracture: Secondary | ICD-10-CM

## 2013-08-14 DIAGNOSIS — S92309A Fracture of unspecified metatarsal bone(s), unspecified foot, initial encounter for closed fracture: Secondary | ICD-10-CM

## 2013-08-14 MED ORDER — HYDROCODONE-ACETAMINOPHEN 7.5-325 MG PO TABS: 1.0000 | ORAL_TABLET | ORAL | Status: DC | PRN

## 2013-08-14 NOTE — Progress Notes (Signed)
Patient ID: Jaclyn Schultz, female   DOB: 12/05/1933, 79 y.o.   MRN: [id number]  Chief Complaint  Patient presents with  . Foot Pain     Oblique mildly displaced fracture of the distal left 5th metatarsal. DOI 08/13/13    HISTORY: 79 years old got up from a chair foot caught in the chair date of injury December 15. Sharp pain in 8/10 pain constant pain associated bruising and swelling  Negative review of systems  The past, family history and social history have been reviewed and are recorded in the corresponding sections of epic   BP 131/74  Ht 5\' 4"  (1.626 m)  Wt 144 lb (65.318 kg)  BMI 24.71 kg/m2 General appearance is normal, the patient is alert and oriented x3 with normal mood and affect. She walks well with a Cam Walker. Her foot is bruised tender swollen no range of motion deficits no stability problems no atrophy no motor deficits normal sensation and good pulse  X-ray shows a oblique fracture the fifth metatarsal  Continue Cam Walker x-ray 8 weeks  Meds ordered this encounter  Medications  . HYDROcodone-acetaminophen (NORCO) 7.5-325 MG per tablet    Sig: Take 1 tablet by mouth every 4 (four) hours as needed for moderate pain.    Dispense:  84 tablet    Refill:  0    Office visit and fracture care

## 2013-08-14 NOTE — Patient Instructions (Signed)
Wear cam walker x 8 weeks

## 2013-08-15 NOTE — ED Provider Notes (Signed)
CSN: [id number]     Arrival date & time 08/13/13  2158 History   First MD Initiated Contact with Patient 08/13/13 2316     Chief Complaint  Patient presents with  . Foot Pain   (Consider location/radiation/quality/duration/timing/severity/associated sxs/prior Treatment) Patient is a 79 y.o. female presenting with lower extremity pain. The history is provided by the patient.  Foot Pain This is a new problem. Episode onset: several hours prior to Ed arrival. The problem occurs constantly. The problem has been unchanged. Associated symptoms include arthralgias and joint swelling. Pertinent negatives include no chest pain, chills, fever, headaches, neck pain, numbness, rash, vertigo, vomiting or weakness. Exacerbated by: weight bearing. She has tried ice for the symptoms. The treatment provided no relief.   Patient c/o pain, swelling and bruising to her lateral left foot.  Symptoms began after she stood up from a seated position and "rolled" her foot.  She states she heard a loud "pop".  She denies fall, ankle pain or hip pain.   Past Medical History  Diagnosis Date  . Vitamin B 12 deficiency   . Vertigo   . High cholesterol   . Hypertension   . 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 Mandible surgery    . 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 Appendectomy    . Cervical disc surgery     History reviewed. No pertinent family history. History  Substance Use Topics  . Smoking status: Never Smoker   . Smokeless tobacco: Never Used  . Alcohol Use: No   OB History   Grav Para Term Preterm Abortions TAB SAB Ect Mult Living                 Review of Systems  Constitutional: Negative for fever and chills.  Respiratory: Negative for shortness of breath.   Cardiovascular: Negative for chest pain.  Gastrointestinal: Negative for vomiting.  Genitourinary: Negative for dysuria and difficulty urinating.  Musculoskeletal:  Positive for arthralgias and joint swelling. Negative for back pain and neck pain.  Skin: Negative for color change, rash and wound.  Neurological: Negative for vertigo, syncope, facial asymmetry, weakness, light-headedness, numbness and headaches.  All other systems reviewed and are negative.    Allergies  Erythromycin base  Home Medications   Current Outpatient Rx  Name  Route  Sig  Dispense  Refill  . alendronate (FOSAMAX) 70 MG tablet   Oral   Take 70 mg by mouth every 7 (seven) days. Take with a full glass of water on an empty stomach. Take on Mondays         . cetirizine (ZYRTEC) 10 MG tablet   Oral   Take 10 mg by mouth daily.           . cholecalciferol (VITAMIN D) 1000 UNITS tablet   Oral   Take 1,000 Units by mouth daily.           Marland Kitchen guaiFENesin (MUCINEX) 600 MG 12 hr tablet   Oral   Take 600 mg by mouth 2 (two) times daily.         . hydrochlorothiazide (HYDRODIURIL) 25 MG tablet   Oral   Take 12.5 mg by mouth daily.         Marland Kitchen HYDROcodone-acetaminophen (NORCO) 7.5-325 MG per tablet   Oral   Take 1 tablet by mouth every 4 (four) hours as needed for moderate pain.  84 tablet   0   . levofloxacin (LEVAQUIN) 500 MG tablet   Oral   Take 500 mg by mouth daily. 10 day course starting on 07/11/2013         . losartan (COZAAR) 100 MG tablet   Oral   Take 1 tablet (100 mg total) by mouth daily.   90 tablet   3   . meclizine (ANTIVERT) 25 MG tablet   Oral   Take 25 mg by mouth 3 (three) times daily as needed.         . metoprolol tartrate (LOPRESSOR) 25 MG tablet   Oral   Take 25 mg by mouth 2 (two) times daily.          . nitrofurantoin (MACRODANTIN) 100 MG capsule   Oral   Take 100 mg by mouth at bedtime.           . potassium chloride (KLOR-CON) 10 MEQ CR tablet   Oral   Take 10 mEq by mouth daily.          . predniSONE (DELTASONE) 20 MG tablet      Take 2 tablets every morning for 3 days, then one tablet every morning for 3  days, then half tablet every morning for 3 days, then STOP.   12 tablet   0   . PROAIR HFA 108 (90 BASE) MCG/ACT inhaler   Inhalation   Inhale 1 puff into the lungs every 6 (six) hours as needed. For shortness of breath/wheezing         . simvastatin (ZOCOR) 40 MG tablet   Oral   Take 40 mg by mouth at bedtime.          . vitamin B-12 (CYANOCOBALAMIN) 1000 MCG tablet   Oral   Take 1,000 mcg by mouth daily.           . vitamin C (ASCORBIC ACID) 500 MG tablet   Oral   Take 500 mg by mouth daily.           Marland Kitchen warfarin (COUMADIN) 5 MG tablet   Oral   Take 2.5-5 mg by mouth See admin instructions. Take one-half tablet (2.5mg  total) on Sundays. Take one tablet (5mg  total) on all other days.          BP 140/79  Pulse 71  Temp(Src) 98.2 F (36.8 C) (Oral)  Resp 18  Ht 5\' 4"  (1.626 m)  Wt 142 lb (64.411 kg)  BMI 24.36 kg/m2  SpO2 99% Physical Exam  Nursing note and vitals reviewed. Constitutional: She is oriented to person, place, and time. She appears well-developed and well-nourished. No distress.  HENT:  Head: Normocephalic and atraumatic.  Neck: Normal range of motion. Neck supple.  Cardiovascular: Normal rate, regular rhythm, normal heart sounds and intact distal pulses.   Pulmonary/Chest: Effort normal and breath sounds normal. No respiratory distress. She exhibits no tenderness.  Musculoskeletal: She exhibits edema and tenderness.  ttp of the lateral left foot.  Moderate STS and bruising present.  DP pulse is brisk,distal sensation intact.  No erythema, abrasion, or bony deformity.  No proximal tenderness.  Neurological: She is alert and oriented to person, place, and time. She exhibits normal muscle tone. Coordination normal.  Skin: Skin is warm and dry.    ED Course  Procedures (including critical care time) Labs Review Labs Reviewed - No data to display Imaging Review Dg Foot Complete Left  08/13/2013   CLINICAL DATA:  Tripped on a rug, lateral foot  pain and bruising  EXAM: LEFT FOOT - COMPLETE 3+ VIEW  COMPARISON:  None  FINDINGS: Osseous demineralization.  Oblique mildly displaced fracture of the distal left 5th metatarsal.  No additional fracture, dislocation, or bone destruction.  Joint spaces preserved.  Scattered small vessel vascular calcifications.  IMPRESSION: Oblique mildly displaced fracture of the distal left 5th metatarsal.   Electronically Signed   By: Ulyses Southward M.D.   On: 08/13/2013 22:42    EKG Interpretation   None       MDM   1. Metatarsal fracture, left, closed, initial encounter      X-rays discussed with the patient and her daughter.  Daughter lives with her.  Cam walker applied, pain improved.  Remains NV intact.  Pt requests Dr. Hilda Lias for follow-up.  Agrees to elevate, ice and orthopedic f/u.  Patient agrees to care plan , appears stable for discharge.  Ison Wichmann L. Trisha Mangle, PA-C 08/15/13 1939

## 2013-08-27 NOTE — ED Provider Notes (Signed)
Medical screening examination/treatment/procedure(s) were performed by non-physician practitioner and as supervising physician I was immediately available for consultation/collaboration.  EKG Interpretation   None         Shontae Rosiles W. Isador Castille, MD 08/27/13 1018 

## 2013-09-05 ENCOUNTER — Encounter: Payer: Self-pay | Admitting: *Deleted

## 2013-09-26 ENCOUNTER — Ambulatory Visit (INDEPENDENT_AMBULATORY_CARE_PROVIDER_SITE_OTHER): Payer: Medicare HMO | Admitting: *Deleted

## 2013-09-26 ENCOUNTER — Encounter (INDEPENDENT_AMBULATORY_CARE_PROVIDER_SITE_OTHER): Payer: Self-pay

## 2013-09-26 DIAGNOSIS — I4891 Unspecified atrial fibrillation: Secondary | ICD-10-CM

## 2013-09-26 DIAGNOSIS — Z5181 Encounter for therapeutic drug level monitoring: Secondary | ICD-10-CM

## 2013-09-26 DIAGNOSIS — Z7901 Long term (current) use of anticoagulants: Secondary | ICD-10-CM

## 2013-09-26 LAB — POCT INR: INR: 1.9

## 2013-10-09 ENCOUNTER — Encounter: Payer: Self-pay | Admitting: Orthopedic Surgery

## 2013-10-09 ENCOUNTER — Ambulatory Visit (INDEPENDENT_AMBULATORY_CARE_PROVIDER_SITE_OTHER): Payer: Medicare HMO

## 2013-10-09 ENCOUNTER — Ambulatory Visit (INDEPENDENT_AMBULATORY_CARE_PROVIDER_SITE_OTHER): Payer: Commercial Managed Care - HMO | Admitting: Orthopedic Surgery

## 2013-10-09 VITALS — BP 130/69 | Ht 64.0 in | Wt 144.0 lb

## 2013-10-09 DIAGNOSIS — S92902A Unspecified fracture of left foot, initial encounter for closed fracture: Secondary | ICD-10-CM

## 2013-10-09 DIAGNOSIS — S92909A Unspecified fracture of unspecified foot, initial encounter for closed fracture: Secondary | ICD-10-CM

## 2013-10-09 NOTE — Progress Notes (Signed)
Patient ID: Jaclyn Schultz, female   DOB: [birth year], 80 y.o.   MRN: [id number] Chief Complaint  Patient presents with  . Follow-up    8 week recheck on left foot fracture with xray. DOI 08-13-13.    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 Recommend activities as tolerated

## 2013-10-09 NOTE — Patient Instructions (Signed)
activities as tolerated 

## 2013-10-24 ENCOUNTER — Ambulatory Visit (INDEPENDENT_AMBULATORY_CARE_PROVIDER_SITE_OTHER): Payer: Commercial Managed Care - HMO | Admitting: *Deleted

## 2013-10-24 DIAGNOSIS — Z7901 Long term (current) use of anticoagulants: Secondary | ICD-10-CM

## 2013-10-24 DIAGNOSIS — Z5181 Encounter for therapeutic drug level monitoring: Secondary | ICD-10-CM

## 2013-10-24 DIAGNOSIS — I4891 Unspecified atrial fibrillation: Secondary | ICD-10-CM

## 2013-10-24 LAB — POCT INR: INR: 4.5

## 2013-11-07 ENCOUNTER — Ambulatory Visit (INDEPENDENT_AMBULATORY_CARE_PROVIDER_SITE_OTHER): Payer: Medicare HMO | Admitting: *Deleted

## 2013-11-07 DIAGNOSIS — Z7901 Long term (current) use of anticoagulants: Secondary | ICD-10-CM

## 2013-11-07 DIAGNOSIS — I4891 Unspecified atrial fibrillation: Secondary | ICD-10-CM

## 2013-11-07 DIAGNOSIS — Z5181 Encounter for therapeutic drug level monitoring: Secondary | ICD-10-CM

## 2013-11-07 LAB — POCT INR: INR: 3.6

## 2013-11-15 ENCOUNTER — Telehealth: Payer: Self-pay | Admitting: *Deleted

## 2013-11-15 MED ORDER — WARFARIN SODIUM 5 MG PO TABS: 2.5000 mg | ORAL_TABLET | ORAL | Status: DC

## 2013-11-15 NOTE — Telephone Encounter (Signed)
Rx sent to [facility]

## 2013-11-21 ENCOUNTER — Ambulatory Visit (INDEPENDENT_AMBULATORY_CARE_PROVIDER_SITE_OTHER): Payer: Medicare HMO | Admitting: *Deleted

## 2013-11-21 DIAGNOSIS — I4891 Unspecified atrial fibrillation: Secondary | ICD-10-CM

## 2013-11-21 DIAGNOSIS — Z7901 Long term (current) use of anticoagulants: Secondary | ICD-10-CM

## 2013-11-21 DIAGNOSIS — Z5181 Encounter for therapeutic drug level monitoring: Secondary | ICD-10-CM

## 2013-11-21 LAB — POCT INR: INR: 4.7

## 2013-12-05 ENCOUNTER — Ambulatory Visit (INDEPENDENT_AMBULATORY_CARE_PROVIDER_SITE_OTHER): Payer: Medicare HMO | Admitting: *Deleted

## 2013-12-05 DIAGNOSIS — Z5181 Encounter for therapeutic drug level monitoring: Secondary | ICD-10-CM

## 2013-12-05 DIAGNOSIS — I4891 Unspecified atrial fibrillation: Secondary | ICD-10-CM

## 2013-12-05 DIAGNOSIS — Z7901 Long term (current) use of anticoagulants: Secondary | ICD-10-CM

## 2013-12-05 LAB — POCT INR: INR: 1.6

## 2013-12-24 ENCOUNTER — Ambulatory Visit (INDEPENDENT_AMBULATORY_CARE_PROVIDER_SITE_OTHER): Payer: Medicare HMO | Admitting: *Deleted

## 2013-12-24 DIAGNOSIS — Z7901 Long term (current) use of anticoagulants: Secondary | ICD-10-CM

## 2013-12-24 DIAGNOSIS — I4891 Unspecified atrial fibrillation: Secondary | ICD-10-CM

## 2013-12-24 DIAGNOSIS — Z5181 Encounter for therapeutic drug level monitoring: Secondary | ICD-10-CM

## 2013-12-24 LAB — POCT INR: INR: 1.2

## 2013-12-31 ENCOUNTER — Ambulatory Visit (INDEPENDENT_AMBULATORY_CARE_PROVIDER_SITE_OTHER): Payer: Medicare HMO | Admitting: *Deleted

## 2013-12-31 DIAGNOSIS — I4891 Unspecified atrial fibrillation: Secondary | ICD-10-CM

## 2013-12-31 DIAGNOSIS — Z5181 Encounter for therapeutic drug level monitoring: Secondary | ICD-10-CM

## 2013-12-31 DIAGNOSIS — Z7901 Long term (current) use of anticoagulants: Secondary | ICD-10-CM

## 2013-12-31 LAB — POCT INR: INR: 1.6

## 2014-01-07 ENCOUNTER — Ambulatory Visit (INDEPENDENT_AMBULATORY_CARE_PROVIDER_SITE_OTHER): Payer: Medicare HMO | Admitting: *Deleted

## 2014-01-07 DIAGNOSIS — Z5181 Encounter for therapeutic drug level monitoring: Secondary | ICD-10-CM

## 2014-01-07 DIAGNOSIS — Z7901 Long term (current) use of anticoagulants: Secondary | ICD-10-CM

## 2014-01-07 DIAGNOSIS — I4891 Unspecified atrial fibrillation: Secondary | ICD-10-CM

## 2014-01-07 LAB — POCT INR: INR: 2.4

## 2014-01-23 ENCOUNTER — Ambulatory Visit (INDEPENDENT_AMBULATORY_CARE_PROVIDER_SITE_OTHER): Payer: Medicare HMO | Admitting: *Deleted

## 2014-01-23 DIAGNOSIS — I4891 Unspecified atrial fibrillation: Secondary | ICD-10-CM

## 2014-01-23 DIAGNOSIS — Z7901 Long term (current) use of anticoagulants: Secondary | ICD-10-CM

## 2014-01-23 DIAGNOSIS — Z5181 Encounter for therapeutic drug level monitoring: Secondary | ICD-10-CM

## 2014-01-23 LAB — POCT INR: INR: 3.4

## 2014-02-13 ENCOUNTER — Ambulatory Visit (INDEPENDENT_AMBULATORY_CARE_PROVIDER_SITE_OTHER): Payer: Commercial Managed Care - HMO | Admitting: *Deleted

## 2014-02-13 DIAGNOSIS — I4891 Unspecified atrial fibrillation: Secondary | ICD-10-CM

## 2014-02-13 DIAGNOSIS — Z7901 Long term (current) use of anticoagulants: Secondary | ICD-10-CM

## 2014-02-13 DIAGNOSIS — Z5181 Encounter for therapeutic drug level monitoring: Secondary | ICD-10-CM

## 2014-02-13 LAB — POCT INR: INR: 3

## 2014-03-13 ENCOUNTER — Ambulatory Visit (INDEPENDENT_AMBULATORY_CARE_PROVIDER_SITE_OTHER): Payer: Commercial Managed Care - HMO | Admitting: *Deleted

## 2014-03-13 DIAGNOSIS — Z5181 Encounter for therapeutic drug level monitoring: Secondary | ICD-10-CM

## 2014-03-13 DIAGNOSIS — Z7901 Long term (current) use of anticoagulants: Secondary | ICD-10-CM

## 2014-03-13 DIAGNOSIS — I4891 Unspecified atrial fibrillation: Secondary | ICD-10-CM

## 2014-03-13 LAB — POCT INR: INR: 3.6

## 2014-04-03 ENCOUNTER — Encounter: Payer: Self-pay | Admitting: *Deleted

## 2014-04-26 ENCOUNTER — Telehealth: Payer: Self-pay | Admitting: Cardiovascular Disease

## 2014-04-26 MED ORDER — WARFARIN SODIUM 5 MG PO TABS: ORAL_TABLET | ORAL | Status: DC

## 2014-04-26 NOTE — Telephone Encounter (Signed)
Received fax refill request  Rx # [id number] Medication:  Warfarin Sodium 5 mg tablets (peach) Qty 30 Sig:  Take 1/2 tablet on Sundays, then 1 daily Monday-Saturday Physician:  Bronson Ing

## 2014-05-31 NOTE — Progress Notes (Signed)
HPI: [name] is a 80 year old female patient of Dr. Bronson Ing that we follow for ongoing assessment and management of PAF, hypertension, and hypercholesterolemia.   She had cardiac catheterization in March of 2011 reportedly showing no evidence of destructive CAT or coronary vasospasm. She was last seen by Dr. Bronson Ing in July 2014, it was found to be in normal sinus rhythm. Her blood pressure was not well-controlled and therefore losartan was increased to 100 mg daily. She was continued on going risk modification.  She is doing well. She has not had any hospitalizations, ER visits, surgeries. She has not had PT INR checked in over a year, per patient, She denies chest pain, palpitations, heart racing or any fatigue. She is compliant with her medications.   Allergies  Allergen Reactions  . Erythromycin Base Rash    Current Outpatient Prescriptions  Medication Sig Dispense Refill  . alendronate (FOSAMAX) 70 MG tablet Take 70 mg by mouth every 7 (seven) days. Take with a full glass of water on an empty stomach. Take on Mondays      . cetirizine (ZYRTEC) 10 MG tablet Take 10 mg by mouth daily.        . cholecalciferol (VITAMIN D) 1000 UNITS tablet Take 1,000 Units by mouth daily.        . hydrochlorothiazide (HYDRODIURIL) 25 MG tablet Take 12.5 mg by mouth daily.      Marland Kitchen losartan (COZAAR) 100 MG tablet Take 1 tablet (100 mg total) by mouth daily.  90 tablet  3  . metoprolol tartrate (LOPRESSOR) 25 MG tablet Take 25 mg by mouth 2 (two) times daily.       . nitrofurantoin (MACRODANTIN) 100 MG capsule Take 100 mg by mouth at bedtime.        . potassium chloride (KLOR-CON) 10 MEQ CR tablet Take 10 mEq by mouth daily.       Marland Kitchen PROAIR HFA 108 (90 BASE) MCG/ACT inhaler Inhale 1 puff into the lungs every 6 (six) hours as needed. For shortness of breath/wheezing      . simvastatin (ZOCOR) 40 MG tablet Take 40 mg by mouth at bedtime.       . vitamin B-12 (CYANOCOBALAMIN) 1000 MCG tablet Take  1,000 mcg by mouth daily.        . vitamin C (ASCORBIC ACID) 500 MG tablet Take 500 mg by mouth daily.        Marland Kitchen warfarin (COUMADIN) 5 MG tablet Take 1 tablet daily or as directed  30 tablet  3  . FLUVIRIN SUSP       . HYDROcodone-acetaminophen (NORCO) 7.5-325 MG per tablet Take 1 tablet by mouth every 4 (four) hours as needed for moderate pain.  84 tablet  0   No current facility-administered medications for this visit.    Past Medical History  Diagnosis Date  . Vitamin B 12 deficiency   . Vertigo   . High cholesterol   . Hypertension   . Cancer     mouth/dx 08-2010/surg only    Past Surgical History  Procedure Laterality Date  . Mandible surgery    . 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 Appendectomy    . Cervical disc surgery      ROS: Review of systems complete and found to be negative unless listed above  PHYSICAL EXAM BP 118/82  Pulse 80  Ht 5\' 4"  (1.626 m)  Wt 147 lb (66.679 kg)  BMI 25.22 kg/m2  General: Well developed, well nourished, in no acute distress Head: Eyes PERRLA, No xanthomas.   Normal cephalic and atramatic  Lungs: Clear bilaterally to auscultation and percussion. Heart: HRRR S1 S2, without MRG.  Pulses are 2+ & equal.            No carotid bruit. No JVD.  No abdominal bruits. No femoral bruits. Abdomen: Bowel sounds are positive, abdomen soft and non-tender without masses or                  Hernia's noted. Msk:  Back normal, normal gait. Normal strength and tone for age. Extremities: No clubbing, cyanosis or edema.  DP +1 Neuro: Alert and oriented X 3. Psych:  Good affect, responds appropriately   EKG: NSR rate of 71 bpm.   ASSESSMENT AND PLAN

## 2014-06-03 ENCOUNTER — Ambulatory Visit (INDEPENDENT_AMBULATORY_CARE_PROVIDER_SITE_OTHER): Payer: Commercial Managed Care - HMO | Admitting: Pharmacist

## 2014-06-03 ENCOUNTER — Ambulatory Visit (INDEPENDENT_AMBULATORY_CARE_PROVIDER_SITE_OTHER): Payer: Commercial Managed Care - HMO | Admitting: Adult Health

## 2014-06-03 ENCOUNTER — Encounter: Payer: Self-pay | Admitting: Adult Health

## 2014-06-03 VITALS — BP 118/82 | HR 80 | Ht 64.0 in | Wt 147.0 lb

## 2014-06-03 DIAGNOSIS — I4891 Unspecified atrial fibrillation: Secondary | ICD-10-CM

## 2014-06-03 DIAGNOSIS — E782 Mixed hyperlipidemia: Secondary | ICD-10-CM

## 2014-06-03 DIAGNOSIS — Z5181 Encounter for therapeutic drug level monitoring: Secondary | ICD-10-CM

## 2014-06-03 DIAGNOSIS — Z7901 Long term (current) use of anticoagulants: Secondary | ICD-10-CM

## 2014-06-03 DIAGNOSIS — I482 Chronic atrial fibrillation, unspecified: Secondary | ICD-10-CM

## 2014-06-03 DIAGNOSIS — I10 Essential (primary) hypertension: Secondary | ICD-10-CM

## 2014-06-03 LAB — POCT INR: INR: 1.6

## 2014-06-03 NOTE — Assessment & Plan Note (Signed)
Good control of BP will check BMET for kidney fx on ARB.

## 2014-06-03 NOTE — Assessment & Plan Note (Signed)
Will check fastng lipids and LFT's . Continue statin therapy.

## 2014-06-03 NOTE — Assessment & Plan Note (Signed)
She is in NSR on this visit with good HR control.She continues to take metoprolol and warfarin. INR will be checked today by our coumadin clinic as she has not had this done since July 2015. For now I will not make any changes.

## 2014-06-03 NOTE — Progress Notes (Deleted)
Name: Jaclyn Schultz    DOB: 12/30/1933  Age: 80 y.o.  MR#: [id number]       PCP:  Robert Bellow, MD      Insurance: Payor: [employer] / Plan: [place] THN/NTSP / Product Type: *No Product type* /   CC:    Chief Complaint  Patient presents with  . Atrial Fibrillation  . Hypertension    VS Filed Vitals:   06/03/14 1324  BP: 118/82  Pulse: 80  Height: 5\' 4"  (1.626 m)  Weight: 147 lb (66.679 kg)    Weights Current Weight  06/03/14 147 lb (66.679 kg)  10/09/13 144 lb (65.318 kg)  08/14/13 144 lb (65.318 kg)    Blood Pressure  BP Readings from Last 3 Encounters:  06/03/14 118/82  10/09/13 130/69  08/14/13 131/74     Admit date:  (Not on file) Last encounter with RMR:  Visit date not found   Allergy Erythromycin base  Current Outpatient Prescriptions  Medication Sig Dispense Refill  . alendronate (FOSAMAX) 70 MG tablet Take 70 mg by mouth every 7 (seven) days. Take with a full glass of water on an empty stomach. Take on Mondays      . cetirizine (ZYRTEC) 10 MG tablet Take 10 mg by mouth daily.        . cholecalciferol (VITAMIN D) 1000 UNITS tablet Take 1,000 Units by mouth daily.        . hydrochlorothiazide (HYDRODIURIL) 25 MG tablet Take 12.5 mg by mouth daily.      Marland Kitchen losartan (COZAAR) 100 MG tablet Take 1 tablet (100 mg total) by mouth daily.  90 tablet  3  . metoprolol tartrate (LOPRESSOR) 25 MG tablet Take 25 mg by mouth 2 (two) times daily.       . nitrofurantoin (MACRODANTIN) 100 MG capsule Take 100 mg by mouth at bedtime.        . potassium chloride (KLOR-CON) 10 MEQ CR tablet Take 10 mEq by mouth daily.       Marland Kitchen PROAIR HFA 108 (90 BASE) MCG/ACT inhaler Inhale 1 puff into the lungs every 6 (six) hours as needed. For shortness of breath/wheezing      . simvastatin (ZOCOR) 40 MG tablet Take 40 mg by mouth at bedtime.       . vitamin B-12 (CYANOCOBALAMIN) 1000 MCG tablet Take 1,000 mcg by mouth daily.        . vitamin C (ASCORBIC ACID) 500 MG  tablet Take 500 mg by mouth daily.        Marland Kitchen warfarin (COUMADIN) 5 MG tablet Take 1 tablet daily or as directed  30 tablet  3  . FLUVIRIN SUSP       . HYDROcodone-acetaminophen (NORCO) 7.5-325 MG per tablet Take 1 tablet by mouth every 4 (four) hours as needed for moderate pain.  84 tablet  0   No current facility-administered medications for this visit.    Discontinued Meds:    Medications Discontinued During This Encounter  Medication Reason  . potassium chloride (K-DUR,KLOR-CON) 10 MEQ tablet Error  . predniSONE (DELTASONE) 20 MG tablet Error  . guaiFENesin (MUCINEX) 600 MG 12 hr tablet Error  . levofloxacin (LEVAQUIN) 500 MG tablet Error  . meclizine (ANTIVERT) 25 MG tablet Error    Patient Active Problem List   Diagnosis Date Noted  . Foot fracture, left 10/09/2013  . Encounter for therapeutic drug monitoring 09/26/2013  . Metatarsal fracture 08/14/2013  . Bronchitis, acute 07/12/2013  . SOB (  shortness of breath) 07/12/2013  . 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 Chronic periodontitis, unspecified 03/22/2013  . Allergic rhinitis due to pollen 03/22/2013  . 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 Malignant neoplasm of head, face, and neck 03/22/2013  . Other B-complex deficiencies 03/22/2013  . Mixed hyperlipidemia 03/22/2013  . Essential hypertension, benign 03/22/2013  . Reflux esophagitis 03/22/2013  . Senile osteoporosis 03/22/2013  . Atrial fibrillation 11/05/2012  . Long term (current) use of anticoagulants 11/05/2012    LABS    Component Value Date/Time   NA 139 07/12/2013 0527   NA 140 07/11/2013 2253   NA 140 04/19/2011 0917   K 3.3* 07/12/2013 0527   K 3.4* 07/11/2013 2253   K 4.2 04/19/2011 0917   CL 101 07/12/2013 0527   CL 102 07/11/2013 2253   CL 104 04/19/2011 0917   CO2 22 07/12/2013 0527   CO2 25 07/11/2013 2253   CO2 28 04/19/2011 0917   GLUCOSE 135* 07/12/2013 0527    GLUCOSE 118* 07/11/2013 2253   GLUCOSE 98 04/19/2011 0917   BUN 21 07/12/2013 0527   BUN 25* 07/11/2013 2253   BUN 16 04/19/2011 0917   CREATININE 1.12* 07/12/2013 0527   CREATININE 1.10 07/11/2013 2253   CREATININE 0.85 04/19/2011 0917   CALCIUM 10.1 07/12/2013 0527   CALCIUM 10.0 07/11/2013 2253   CALCIUM 9.6 04/19/2011 0917   GFRNONAA 46* 07/12/2013 0527   GFRNONAA 47* 07/11/2013 2253   GFRNONAA >60 04/19/2011 0917   GFRAA 53* 07/12/2013 0527   GFRAA 55* 07/11/2013 2253   GFRAA >60 04/19/2011 0917   CMP     Component Value Date/Time   NA 139 07/12/2013 0527   K 3.3* 07/12/2013 0527   CL 101 07/12/2013 0527   CO2 22 07/12/2013 0527   GLUCOSE 135* 07/12/2013 0527   BUN 21 07/12/2013 0527   CREATININE 1.12* 07/12/2013 0527   CALCIUM 10.1 07/12/2013 0527   PROT 7.2 07/11/2013 2253   ALBUMIN 3.3* 07/11/2013 2253   AST 20 07/11/2013 2253   ALT 15 07/11/2013 2253   ALKPHOS 77 07/11/2013 2253   BILITOT 0.4 07/11/2013 2253   GFRNONAA 46* 07/12/2013 0527   GFRAA 53* 07/12/2013 0527       Component Value Date/Time   WBC 6.6 07/12/2013 0527   WBC 8.6 07/11/2013 2253   WBC 6.8 04/19/2011 0917   HGB 13.8 07/12/2013 0527   HGB 13.8 07/11/2013 2253   HGB 14.6 04/19/2011 0917   HCT 39.5 07/12/2013 0527   HCT 39.6 07/11/2013 2253   HCT 44.1 04/19/2011 0917   MCV 88.6 07/12/2013 0527   MCV 88.8 07/11/2013 2253   MCV 90.4 04/19/2011 0917    Lipid Panel  No results found for this basename: chol, trig, hdl, cholhdl, vldl, ldlcalc, ldldirect    ABG    Component Value Date/Time   TCO2 26 09/30/2008 0344     Lab Results  Component Value Date   TSH 2.826 ***Test methodology is 3rd generation TSH**** 05/13/2009   BNP (last 3 results) No results found for this basename: PROBNP,  in the last 8760 hours Cardiac Panel (last 3 results) No results found for this basename: CKTOTAL, CKMB, TROPONINI, RELINDX,  in the last 72 hours  Iron/TIBC/Ferritin/ %Sat No results found for this  basename: iron, tibc, ferritin, ironpctsat     EKG Orders placed in visit on 06/03/14  . 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

## 2014-06-03 NOTE — Patient Instructions (Signed)
Your physician wants you to follow-up in: 9 months with Dr. Ferne Reus will receive a reminder letter in the mail two months in advance. If you don't receive a letter, please call our office to schedule the follow-up appointment.  Your physician recommends that you return for lab work BMP/Lipids/Liver  Your physician recommends that you continue on your current medications as directed. Please refer to the Current Medication list given to you today.  Thank you for choosing [facility]!!

## 2014-06-05 LAB — HEPATIC FUNCTION PANEL
ALBUMIN: 4.1 g/dL (ref 3.5–5.2)
ALT: 12 U/L (ref 0–35)
AST: 20 U/L (ref 0–37)
Alkaline Phosphatase: 73 U/L (ref 39–117)
BILIRUBIN DIRECT: 0.1 mg/dL (ref 0.0–0.3)
Indirect Bilirubin: 0.4 mg/dL (ref 0.2–1.2)
Total Bilirubin: 0.5 mg/dL (ref 0.2–1.2)
Total Protein: 6.6 g/dL (ref 6.0–8.3)

## 2014-06-05 LAB — BASIC METABOLIC PANEL WITH GFR
BUN: 22 mg/dL (ref 6–23)
CHLORIDE: 104 meq/L (ref 96–112)
CO2: 28 mEq/L (ref 19–32)
Calcium: 9.6 mg/dL (ref 8.4–10.5)
Creat: 0.95 mg/dL (ref 0.50–1.10)
GFR, Est African American: 67 mL/min
GFR, Est Non African American: 58 mL/min — ABNORMAL LOW
Glucose, Bld: 77 mg/dL (ref 70–99)
Potassium: 3.7 mEq/L (ref 3.5–5.3)
SODIUM: 141 meq/L (ref 135–145)

## 2014-06-05 LAB — LIPID PANEL
Cholesterol: 198 mg/dL (ref 0–200)
HDL: 52 mg/dL (ref >39)
LDL Cholesterol: 118 mg/dL — ABNORMAL HIGH (ref 0–99)
TRIGLYCERIDES: 140 mg/dL (ref <150)
Total CHOL/HDL Ratio: 3.8 Ratio
VLDL: 28 mg/dL (ref 0–40)

## 2014-06-17 ENCOUNTER — Ambulatory Visit (INDEPENDENT_AMBULATORY_CARE_PROVIDER_SITE_OTHER): Payer: Commercial Managed Care - HMO | Admitting: *Deleted

## 2014-06-17 DIAGNOSIS — I4891 Unspecified atrial fibrillation: Secondary | ICD-10-CM

## 2014-06-17 DIAGNOSIS — Z5181 Encounter for therapeutic drug level monitoring: Secondary | ICD-10-CM

## 2014-06-17 DIAGNOSIS — Z7901 Long term (current) use of anticoagulants: Secondary | ICD-10-CM

## 2014-06-17 LAB — POCT INR: INR: 1

## 2014-06-24 ENCOUNTER — Ambulatory Visit (INDEPENDENT_AMBULATORY_CARE_PROVIDER_SITE_OTHER): Payer: Commercial Managed Care - HMO | Admitting: *Deleted

## 2014-06-24 DIAGNOSIS — I4891 Unspecified atrial fibrillation: Secondary | ICD-10-CM

## 2014-06-24 DIAGNOSIS — Z7901 Long term (current) use of anticoagulants: Secondary | ICD-10-CM

## 2014-06-24 DIAGNOSIS — Z5181 Encounter for therapeutic drug level monitoring: Secondary | ICD-10-CM

## 2014-06-24 LAB — POCT INR: INR: 2.5

## 2014-07-17 ENCOUNTER — Ambulatory Visit (INDEPENDENT_AMBULATORY_CARE_PROVIDER_SITE_OTHER): Payer: Commercial Managed Care - HMO | Admitting: *Deleted

## 2014-07-17 DIAGNOSIS — Z5181 Encounter for therapeutic drug level monitoring: Secondary | ICD-10-CM

## 2014-07-17 DIAGNOSIS — Z7901 Long term (current) use of anticoagulants: Secondary | ICD-10-CM

## 2014-07-17 DIAGNOSIS — I4891 Unspecified atrial fibrillation: Secondary | ICD-10-CM

## 2014-07-17 LAB — POCT INR: INR: 3.3

## 2014-08-07 ENCOUNTER — Ambulatory Visit (INDEPENDENT_AMBULATORY_CARE_PROVIDER_SITE_OTHER): Payer: Commercial Managed Care - HMO | Admitting: *Deleted

## 2014-08-07 DIAGNOSIS — Z7901 Long term (current) use of anticoagulants: Secondary | ICD-10-CM

## 2014-08-07 DIAGNOSIS — I4891 Unspecified atrial fibrillation: Secondary | ICD-10-CM

## 2014-08-07 DIAGNOSIS — Z5181 Encounter for therapeutic drug level monitoring: Secondary | ICD-10-CM

## 2014-08-07 LAB — POCT INR: INR: 2.4

## 2014-09-09 ENCOUNTER — Ambulatory Visit (INDEPENDENT_AMBULATORY_CARE_PROVIDER_SITE_OTHER): Payer: Commercial Managed Care - HMO | Admitting: *Deleted

## 2014-09-09 DIAGNOSIS — Z7901 Long term (current) use of anticoagulants: Secondary | ICD-10-CM

## 2014-09-09 DIAGNOSIS — Z5181 Encounter for therapeutic drug level monitoring: Secondary | ICD-10-CM

## 2014-09-09 DIAGNOSIS — I4891 Unspecified atrial fibrillation: Secondary | ICD-10-CM

## 2014-09-09 LAB — POCT INR: INR: 2.4

## 2014-09-10 ENCOUNTER — Telehealth: Payer: Self-pay | Admitting: Cardiovascular Disease

## 2014-09-10 MED ORDER — WARFARIN SODIUM 5 MG PO TABS: ORAL_TABLET | ORAL | Status: DC

## 2014-09-10 NOTE — Telephone Encounter (Signed)
Received fax refill request  Rx # [id number] Medication:  Warfarin Sod 5 mg tablets  Qty 30 Sig:  Take one tablet by mouth every day or as directed Physician:  Bronson Ing

## 2014-09-26 ENCOUNTER — Emergency Department (HOSPITAL_COMMUNITY): Payer: Commercial Managed Care - HMO

## 2014-09-26 ENCOUNTER — Encounter (HOSPITAL_COMMUNITY): Payer: Self-pay | Admitting: Emergency Medicine

## 2014-09-26 ENCOUNTER — Emergency Department (HOSPITAL_COMMUNITY)
Admission: EM | Admit: 2014-09-26 | Discharge: 2014-09-26 | Disposition: A | Discharge status: Home / Self Care | Payer: Commercial Managed Care - HMO | Attending: Emergency Medicine | Admitting: Emergency Medicine

## 2014-09-26 DIAGNOSIS — E780 Pure hypercholesterolemia: Secondary | ICD-10-CM | POA: Insufficient documentation

## 2014-09-26 DIAGNOSIS — R0789 Other chest pain: Secondary | ICD-10-CM | POA: Diagnosis not present

## 2014-09-26 DIAGNOSIS — I10 Essential (primary) hypertension: Secondary | ICD-10-CM | POA: Insufficient documentation

## 2014-09-26 DIAGNOSIS — R079 Chest pain, unspecified: Secondary | ICD-10-CM | POA: Diagnosis present

## 2014-09-26 DIAGNOSIS — Z9889 Other specified postprocedural states: Secondary | ICD-10-CM | POA: Diagnosis not present

## 2014-09-26 DIAGNOSIS — Z85818 Personal history of malignant neoplasm of other sites of lip, oral cavity, and pharynx: Secondary | ICD-10-CM | POA: Diagnosis not present

## 2014-09-26 DIAGNOSIS — R059 Cough, unspecified: Secondary | ICD-10-CM

## 2014-09-26 DIAGNOSIS — R05 Cough: Secondary | ICD-10-CM

## 2014-09-26 LAB — COMPREHENSIVE METABOLIC PANEL
ALK PHOS: 70 U/L (ref 39–117)
ALT: 17 U/L (ref 0–35)
AST: 24 U/L (ref 0–37)
Albumin: 4.1 g/dL (ref 3.5–5.2)
Anion gap: 10 (ref 5–15)
BUN: 24 mg/dL — ABNORMAL HIGH (ref 6–23)
CO2: 25 mmol/L (ref 19–32)
Calcium: 9.6 mg/dL (ref 8.4–10.5)
Chloride: 104 mmol/L (ref 96–112)
Creatinine, Ser: 1.16 mg/dL — ABNORMAL HIGH (ref 0.50–1.10)
GFR calc Af Amer: 51 mL/min — ABNORMAL LOW (ref >90)
GFR calc non Af Amer: 44 mL/min — ABNORMAL LOW (ref >90)
Glucose, Bld: 95 mg/dL (ref 70–99)
POTASSIUM: 3.8 mmol/L (ref 3.5–5.1)
SODIUM: 139 mmol/L (ref 135–145)
Total Bilirubin: 0.9 mg/dL (ref 0.3–1.2)
Total Protein: 7.5 g/dL (ref 6.0–8.3)

## 2014-09-26 LAB — CBC WITH DIFFERENTIAL/PLATELET
Basophils Absolute: 0.1 10*3/uL (ref 0.0–0.1)
Basophils Relative: 1 % (ref 0–1)
EOS PCT: 1 % (ref 0–5)
Eosinophils Absolute: 0.1 10*3/uL (ref 0.0–0.7)
HCT: 44.6 % (ref 36.0–46.0)
Hemoglobin: 15 g/dL (ref 12.0–15.0)
Lymphocytes Relative: 29 % (ref 12–46)
Lymphs Abs: 2.2 10*3/uL (ref 0.7–4.0)
MCH: 31.4 pg (ref 26.0–34.0)
MCHC: 33.6 g/dL (ref 30.0–36.0)
MCV: 93.3 fL (ref 78.0–100.0)
Monocytes Absolute: 0.9 10*3/uL (ref 0.1–1.0)
Monocytes Relative: 12 % (ref 3–12)
NEUTROS ABS: 4.4 10*3/uL (ref 1.7–7.7)
NEUTROS PCT: 57 % (ref 43–77)
PLATELETS: 198 10*3/uL (ref 150–400)
RBC: 4.78 MIL/uL (ref 3.87–5.11)
RDW: 13.7 % (ref 11.5–15.5)
WBC: 7.7 10*3/uL (ref 4.0–10.5)

## 2014-09-26 LAB — PROTIME-INR
INR: 1.97 — ABNORMAL HIGH (ref 0.00–1.49)
Prothrombin Time: 22.6 seconds — ABNORMAL HIGH (ref 11.6–15.2)

## 2014-09-26 LAB — TROPONIN I

## 2014-09-26 MED ORDER — HYDROCODONE-ACETAMINOPHEN 5-325 MG PO TABS
1.0000 | ORAL_TABLET | Freq: Once | ORAL | Status: AC
  Administered 2014-09-26: 1 via ORAL
  Filled 2014-09-26: qty 1

## 2014-09-26 MED ORDER — IOHEXOL 350 MG/ML SOLN
100.0000 mL | Freq: Once | INTRAVENOUS | Status: AC | PRN
  Administered 2014-09-26: 80 mL via INTRAVENOUS

## 2014-09-26 MED ORDER — BENZONATATE 100 MG PO CAPS: 100.0000 mg | ORAL_CAPSULE | Freq: Three times a day (TID) | ORAL | Status: DC | PRN

## 2014-09-26 MED ORDER — HYDROCODONE-IBUPROFEN 7.5-200 MG PO TABS: 1.0000 | ORAL_TABLET | Freq: Four times a day (QID) | ORAL | Status: DC | PRN

## 2014-09-26 NOTE — ED Provider Notes (Signed)
CSN: [id number]     Arrival date & time 09/26/14  1308 History   First MD Initiated Contact with Patient 09/26/14 1641     Chief Complaint  Patient presents with  . Back Pain  . Nasal Congestion  . Chest Pain     HPI Pt was seen at 1650. Per pt and her family, c/o gradual onset and persistence of constant left sided chest "pain" for the past 3 to 4 days. States the pain radiates into the left side of her mid-back, worsens with palpation of the area, movement and coughing. Has been associated with sinus congestion, runny/stuffy nose, post-nasal drip, and coughing. Denies palpitations, no SOB, no abd pain, no N/V/D, no rash, no fevers.    Past Medical History  Diagnosis Date  . Vitamin B 12 deficiency   . Vertigo   . High cholesterol   . Hypertension   . 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 Mandible surgery    . 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 Appendectomy    . Cervical disc surgery    . Cardiac catheterization  10/2009    normal coronary arteries    History  Substance Use Topics  . Smoking status: Never Smoker   . Smokeless tobacco: Never Used  . Alcohol Use: No    Review of Systems ROS: Statement: All systems negative except as marked or noted in the HPI; Constitutional: Negative for fever and chills. ; ; Eyes: Negative for eye pain, redness and discharge. ; ; ENMT: Negative for ear pain, hoarseness,sore throat. +nasal congestion, sinus congestion, rhinorrhea, post-nasal drip. ; ; Cardiovascular: Negative for palpitations, diaphoresis, dyspnea and peripheral edema. ; ; Respiratory: +cough. Negative for wheezing and stridor. ; ; Gastrointestinal: Negative for nausea, vomiting, diarrhea, abdominal pain, blood in stool, hematemesis, jaundice and rectal bleeding. . ; ; Genitourinary: Negative for dysuria, flank pain and hematuria. ; ; Musculoskeletal: +chest wall pain. Negative for back pain and neck pain.  Negative for swelling and trauma.; ; Skin: Negative for pruritus, rash, abrasions, blisters, bruising and skin lesion.; ; Neuro: Negative for headache, lightheadedness and neck stiffness. Negative for weakness, altered level of consciousness , altered mental status, extremity weakness, paresthesias, involuntary movement, seizure and syncope.      Allergies  Erythromycin base  Home Medications   Prior to Admission medications   Medication Sig Start Date End Date Taking? Authorizing Provider  alendronate (FOSAMAX) 70 MG tablet Take 70 mg by mouth every 7 (seven) days. Take with a full glass of water on an empty stomach. Take on Mondays   Yes Historical Provider, MD  vitamin B-12 (CYANOCOBALAMIN) 1000 MCG tablet Take 1,000 mcg by mouth daily.     Yes Historical Provider, MD  vitamin C (ASCORBIC ACID) 500 MG tablet Take 500 mg by mouth daily.     Yes Historical Provider, MD  warfarin (COUMADIN) 5 MG tablet Take 1 tablet daily or as directed Patient taking differently: Take 5 mg by mouth every evening. Take 1 tablet daily or as directed 09/10/14  Yes Herminio Commons, MD  cetirizine (ZYRTEC) 10 MG tablet Take 10 mg by mouth daily.      Historical Provider, MD  Payton Mccallum  04/26/14   Historical Provider, MD  hydrochlorothiazide (HYDRODIURIL) 25 MG tablet Take 12.5 mg by mouth daily.    Historical Provider, MD  HYDROcodone-acetaminophen (NORCO) 7.5-325 MG per tablet Take  1 tablet by mouth every 4 (four) hours as needed for moderate pain. 08/14/13   Carole Civil, MD  losartan (COZAAR) 100 MG tablet Take 1 tablet (100 mg total) by mouth daily. 03/23/13   Herminio Commons, MD  metoprolol tartrate (LOPRESSOR) 25 MG tablet Take 25 mg by mouth 2 (two) times daily.     Historical Provider, MD  nitrofurantoin (MACRODANTIN) 100 MG capsule Take 100 mg by mouth at bedtime.      Historical Provider, MD  potassium chloride (KLOR-CON) 10 MEQ CR tablet Take 10 mEq by mouth daily.     Historical  Provider, MD  PROAIR HFA 108 (90 BASE) MCG/ACT inhaler Inhale 1 puff into the lungs every 6 (six) hours as needed. For shortness of breath/wheezing 07/11/13   Historical Provider, MD  simvastatin (ZOCOR) 40 MG tablet Take 40 mg by mouth at bedtime.     Historical Provider, MD   BP 124/73 mmHg  Pulse 97  Temp(Src) 97.7 F (36.5 C) (Oral)  Resp 16  Ht 5\' 4"  (1.626 m)  Wt 160 lb (72.576 kg)  BMI 27.45 kg/m2  SpO2 98% Physical Exam 1655: Physical examination:  Nursing notes reviewed; Vital signs and O2 SAT reviewed;  Constitutional: Well developed, Well nourished, Well hydrated, In no acute distress; Head:  Normocephalic, atraumatic; Eyes: EOMI, PERRL, No scleral icterus; ENMT: TM's clear bilat. +edemetous nasal turbinates bilat with clear rhinorrhea. Mouth and pharynx without lesions. No tonsillar exudates. No intra-oral edema. No submandibular or sublingual edema. No hoarse voice, no drooling, no stridor. No pain with manipulation of larynx. No trismus. Mouth and pharynx normal, Mucous membranes moist; Neck: Supple, Full range of motion, No lymphadenopathy; Cardiovascular: Regular rate and rhythm, No gallop; Respiratory: Breath sounds clear & equal bilaterally, No rales, rhonchi, wheezes.  Speaking full sentences with ease, Normal respiratory effort/excursion; Chest: +left sided parasternal and anterior chest wall tender to palp. No rash. No deformity. No soft tissue crepitus. Movement normal; Abdomen: Soft, Nontender, Nondistended, Normal bowel sounds; Genitourinary: No CVA tenderness; Spine:  No midline CS, TS, LS tenderness. No rash.;; Extremities: Pulses normal, No tenderness, No edema, No calf edema or asymmetry.; Neuro: AA&Ox3, Major CN grossly intact.  Speech clear. No gross focal motor or sensory deficits in extremities.; Skin: Color normal, Warm, Dry.   ED Course  Procedures     EKG Interpretation None      MDM  MDM Reviewed: previous chart, nursing note and vitals Reviewed  previous: labs and ECG Interpretation: labs, ECG, x-ray and CT scan      Date: 09/26/2014  Rate: 79  Rhythm: normal sinus rhythm  QRS Axis: left  Intervals: normal  ST/T Wave abnormalities: normal  Conduction Disutrbances:none  Narrative Interpretation:   Old EKG Reviewed: unchanged;  No significant changes from previous EKG dated 06/03/2014.    Results for orders placed or performed during the hospital encounter of 09/26/14  CBC with Differential  Result Value Ref Range   WBC 7.7 4.0 - 10.5 K/uL   RBC 4.78 3.87 - 5.11 MIL/uL   Hemoglobin 15.0 12.0 - 15.0 g/dL   HCT 44.6 36.0 - 46.0 %   MCV 93.3 78.0 - 100.0 fL   MCH 31.4 26.0 - 34.0 pg   MCHC 33.6 30.0 - 36.0 g/dL   RDW 13.7 11.5 - 15.5 %   Platelets 198 150 - 400 K/uL   Neutrophils Relative % 57 43 - 77 %   Neutro Abs 4.4 1.7 - 7.7 K/uL   Lymphocytes  Relative 29 12 - 46 %   Lymphs Abs 2.2 0.7 - 4.0 K/uL   Monocytes Relative 12 3 - 12 %   Monocytes Absolute 0.9 0.1 - 1.0 K/uL   Eosinophils Relative 1 0 - 5 %   Eosinophils Absolute 0.1 0.0 - 0.7 K/uL   Basophils Relative 1 0 - 1 %   Basophils Absolute 0.1 0.0 - 0.1 K/uL  Comprehensive metabolic panel  Result Value Ref Range   Sodium 139 135 - 145 mmol/L   Potassium 3.8 3.5 - 5.1 mmol/L   Chloride 104 96 - 112 mmol/L   CO2 25 19 - 32 mmol/L   Glucose, Bld 95 70 - 99 mg/dL   BUN 24 (H) 6 - 23 mg/dL   Creatinine, Ser 1.16 (H) 0.50 - 1.10 mg/dL   Calcium 9.6 8.4 - 10.5 mg/dL   Total Protein 7.5 6.0 - 8.3 g/dL   Albumin 4.1 3.5 - 5.2 g/dL   AST 24 0 - 37 U/L   ALT 17 0 - 35 U/L   Alkaline Phosphatase 70 39 - 117 U/L   Total Bilirubin 0.9 0.3 - 1.2 mg/dL   GFR calc non Af Amer 44 (L) >90 mL/min   GFR calc Af Amer 51 (L) >90 mL/min   Anion gap 10 5 - 15  Troponin I  Result Value Ref Range   Troponin I <0.03 <0.031 ng/mL  Protime-INR  Result Value Ref Range   Prothrombin Time 22.6 (H) 11.6 - 15.2 seconds   INR 1.97 (H) 0.00 - 1.49   Dg Chest 2 View 09/26/2014    CLINICAL DATA:  Chest and back pain and cough for 1 day.  EXAM: CHEST  2 VIEW  COMPARISON:  CT chest 07/12/2013.  PA and lateral chest 07/11/2013.  FINDINGS: Lungs are clear. Heart size is normal. No pneumothorax or pleural effusion. Scattered thoracic spondylosis does not appear changed.  IMPRESSION: No acute disease.   Electronically Signed   By: Inge Rise M.D.   On: 09/26/2014 14:58   Ct Angio Chest Pe W/cm &/or Wo Cm 09/26/2014   CLINICAL DATA:  Chest and low back pain for the last 2 days. Oral cancer.  EXAM: CT ANGIOGRAPHY CHEST WITH CONTRAST  TECHNIQUE: Multidetector CT imaging of the chest was performed using the standard protocol during bolus administration of intravenous contrast. Multiplanar CT image reconstructions and MIPs were obtained to evaluate the vascular anatomy.  CONTRAST:  64mL OMNIPAQUE IOHEXOL 350 MG/ML SOLN  COMPARISON:  07/12/2013; 09/26/2014  FINDINGS: No filling defect is identified in the pulmonary arterial tree to suggest pulmonary embolus. Atherosclerotic calcification of the aortic arch and branch vessels noted. No acute aortic findings are observed.  Small type 1 hiatal hernia.  No pathologic thoracic adenopathy.  Punctate calcification along the right hemidiaphragm.  Biapical pleural parenchymal scarring appear symmetric.  The lungs appear clear.  Thoracic spondylosis noted.  Review of the MIP images confirms the above findings.  IMPRESSION: 1. No pulmonary embolus or acute thoracic findings identified. 2. Small type 1 hiatal hernia.   Electronically Signed   By: Sherryl Barters M.D.   On: 09/26/2014 18:17    1830:  Workup reassuring. Doubt PE as cause for symptoms with normal CT scan.  Doubt ACS as cause for symptoms with normal troponin and unchanged EKG from previous after 3-4 days of constant symptoms. Feels better after meds and wants to go home now. Will tx symptomatically at this time. Dx and testing d/w pt and family.  Questions answered.  Verb understanding,  agreeable to d/c home with outpt f/u.    Francine Graven, DO 09/30/14 1245

## 2014-09-26 NOTE — ED Notes (Signed)
Having nasal congestion, chest pain since yesterday.  Chest pain rated 10, constant, left arm aches.

## 2014-09-26 NOTE — ED Notes (Addendum)
Patient in c/o of chest, lower back pain for last couple of day. No other symptoms, no hx of coronary issues, NAD.

## 2014-09-26 NOTE — Discharge Instructions (Signed)
°Emergency Department Resource Guide °1) Find a Doctor and Pay Out of Pocket °Although you won't have to find out who is covered by your insurance plan, it is a good idea to ask around and get recommendations. You will then need to call the office and see if the doctor you have chosen will accept you as a new patient and what types of options they offer for patients who are self-pay. Some doctors offer discounts or will set up payment plans for their patients who do not have insurance, but you will need to ask so you aren't surprised when you get to your appointment. ° °2) Contact Your Local Health Department °Not all health departments have doctors that can see patients for sick visits, but many do, so it is worth a call to see if yours does. If you don't know where your local health department is, you can check in your phone book. The CDC also has a tool to help you locate your state's health department, and many state websites also have listings of all of their local health departments. ° °3) Find a Walk-in Clinic °If your illness is not likely to be very severe or complicated, you may want to try a walk in clinic. These are popping up all over the country in pharmacies, drugstores, and shopping centers. They're usually staffed by nurse practitioners or physician assistants that have been trained to treat common illnesses and complaints. They're usually fairly quick and inexpensive. However, if you have serious medical issues or chronic medical problems, these are probably not your best option. ° °No Primary Care Doctor: °- Call [facility] at  832-8000 - they can help you locate a primary care doctor that  accepts your insurance, provides certain services, etc. °- Physician Referral Service- 1-800-533-3463 ° °Chronic Pain Problems: °Organization         Address  Phone   Notes  °[facility]  (336) 297-2271 Patients need to be referred by their primary care doctor.  ° °Medication  Assistance: °Organization         Address  Phone   Notes  °[place] [facility] [address] °[place], [place] [address] (336) 641-8030 --Must be a resident of [place] °-- Must have NO insurance coverage whatsoever (no Medicaid/ Medicare, etc.) °-- The pt. MUST have a primary care doctor that directs their care regularly and follows them in the community °  °[facility]  (866) 331-1348   °[employer]  (888) 892-1162   ° °Agencies that provide inexpensive medical care: °Organization         Address  Phone   Notes  °[facility]  (336) 832-8035   °[facility]    (336) 832-7272   °[facility] [address] °[place], [place] [address] (336) 832-4777   °[facility] [address], °[place] (336) 271-4999   °[facility]    (336) 373-0678   °[facility]    (336) 272-1050   °[facility] ° [address], [place] Phone:  (336) 832-4444, Fax:  (336) 832-4440 Hours of Operation:  9 am - 6 pm, M-F.  Also accepts Medicaid/Medicare and self-pay.  °[facility] ° [address], [place] Phone: (336) 832-3150, Fax: (336) 832-3151. Hours of Operation:  8:30 am - 5:30 pm, M-F.  Also accepts Medicaid and self-pay.  °[facility] [place] [address], [place] Phone: (336) 878-6027   °[facility] [address], [place], [place] (336)723-1848, Ext. 123 Mondays & Thursdays: 7-9 AM.  First 15 patients are seen on a first come, first serve basis. °  ° °Medicaid-accepting [place] Providers: ° °Organization         Address  Phone   Notes  °[facility] [address], [place] (336) 641-2100 Also accepts self-pay patients.  °[facility] [address], [place] ° (336) 856-9996   °[facility] [address], [place]  (336) 288-8857   °[facility] [address] [place] [address], [place] (336) 299-7000   °Veita Bland [address], [place]  ° (336) 373-1557 Only accepts [place] Access Medicaid patients after they have their name applied to their card.  ° °Self-Pay (no insurance) in [place]: ° °Organization         Address  Phone   Notes  °Sickle Cell Patients, [facility] [address], [place] (336) 832-1970   °[facility] [address], [place] (336) 832-4400   °[facility] ° [address], [place] (336) 992-4800   °[facility]/Dr. Osei-Bonsu ° [address] [place] [address], [place] or [address], [place] (336) 841-8500 Phone number for both [place] and [place] locations is the same.  °[facility] [address], [place] (336) 299-0000   °[facility] [address] [place] [address], [place] or [address] (336) 852-7530 °(336) 878-2260   °[facility] [address], [place] (336) 350-1642, phone; (336) 294-5005, fax Sees patients 1st and 3rd Saturday of every month.  Must not qualify for public or private insurance (i.e. Medicaid, Medicare, [place] Health Choice, Veterans' Benefits) • Household income should be no more than 200% of the poverty level •The clinic cannot treat you if you are pregnant or think you are pregnant • Sexually transmitted diseases are not treated at the clinic.  ° ° °Dental Care: °Organization         Address  Phone  Notes  °[place] [facility] [address], [place] (336) 641-6152 Accepts children up to age 21 who are enrolled in Medicaid or [place] Health Choice; pregnant women with a Medicaid card; and children who have applied for Medicaid or [place] Health Choice, but were declined, whose parents can pay a reduced fee at time of service.  °[place]  [facility] [place]  [address], [place] (336) 641-7733 Accepts children up to age 21 who are enrolled in Medicaid or [place] Health Choice; pregnant women with a Medicaid card; and children who have applied for Medicaid or  Health Choice, but were declined, whose parents can pay a reduced fee at time of service.  °[facility] ° [address],  (336) 641-4533 Patients are seen by appointment only. Walk-ins are not accepted. [facility] will see patients 18 years of age and older. °Monday - Tuesday (8am-5pm) °Most Wednesdays (8:30-5pm) °$30 per visit, cash only  °[facility] ° [address], [place] (336) 641-4533 Patients are seen by appointment only. Walk-ins are not accepted. [facility] will see patients 18 years of age and older. °One   Wednesday Evening (Monthly: Volunteer Based).  $30 per visit, cash only  °[facility]  (919) 537-3737 for adults; Children under age 4, call Graduate Pediatric Dentistry at ([phone]) [phone]. Children aged 4-14, please call (919) [phone] to request a pediatric application. ° Dental services are provided in all areas of dental care including fillings, crowns and bridges, complete and partial dentures, implants, gum treatment, root canals, and extractions. Preventive care is also provided. Treatment is provided to both adults and children. °Patients are selected via a lottery and there is often a waiting list. °  °[facility] [address], °[place] ° (336) 763-8833 [email or web address].[email or web address] °  °[facility] [address], [place], [place] (336)723-1848, Ext. 123 Second and Fourth Thursday of each month, opens at 6:30 AM; Clinic ends at 9 AM.  Patients are seen on a first-come first-served basis, and a limited number are seen during each clinic.  ° °[facility] ° [address], [place], [place] (336) 723-7904    Eligibility Requirements °You must have lived in [place], [place], or [place] counties for at least the last three months. °  You cannot be eligible for state or federal sponsored healthcare insurance, including Veterans Administration, Medicaid, or Medicare. °  You generally cannot be eligible for healthcare insurance through your employer.  °  How to apply: °Eligibility screenings are held every Tuesday and Wednesday afternoon from 1:00 pm until 4:00 pm. You do not need an appointment for the interview!  °[facility] [address], [place], [place] 336-631-2330   °[facility]  336-342-8273   °[facility]  336-703-3100   °[facility]  336-570-6415   ° °Behavioral Health Resources in the Community: °Intensive Outpatient Programs °Organization         Address  Phone  Notes  °[facility] [address], [place], [place] 336-878-6098   °[facility] [address], [place], [place] 336-832-9800   °[facility] [address], [place], [place] ° 336-882-2125   °[facility] [address],  °[place], [place] 1-800-853-5163 or 336-641-4981   °Substance Abuse Resources °Organization         Address  Phone  Notes  °[facility]  336-882-2125   °[facility]  336-784-9470   °[facility]  336-285-9073   °[facility]  336-845-3988   °Residential & Outpatient Substance Abuse Program  1-800-659-3381   °Psychological Services °Organization         Address  Phone  Notes  °[facility]  336- 832-9600   °[facility]  336- 378-7881   °[facility] [address], [place] 1-800-853-5163 or 336-641-4981   ° °Mobile Crisis Teams °Organization         Address  Phone  Notes  °[facility], Mobile Crisis Care Unit  1-877-626-1772   °Assertive °[facility] ° [address]  [place], [place] 336-834-9664   °Sharon DeEsch [address] °[place] 336-554-5454   ° °Self-Help/Support Groups °Organization         Address  Phone             Notes  °[facility]  - variety of support groups  336- 373-1402 Call for more information  °Narcotics Anonymous (NA), [facility] [address], °[place]  2 meetings at this location  ° °  Residential Treatment Programs Organization         Address  Phone  Notes  [facility] [address]    [place]  1-986-646-1884   [facility]  [address] [place] [address], [place], [place]   [place], [place] [phone] Admissions: 8am-3pm M-F  [facility] [place] [address]    [place], [place] 641-583-0940   [facility] [address] [place], [place], [place]   [facility] [address]  [place], [place]   [facility] Intensive Outpatient [address], [place], [place]   [facility] ([place]  [place], [place] 1-937-499-4013 or 516-400-0273   [facility] ([facility] [address] [place], [place] Accepts Medicaid  [facility] [address],  [place] 1-918-194-7867 Substance Abuse/Addiction Treatment   [facility]         Address  Phone  Notes  [facility]  828-015-0798   Domenic Schwab, PhD [address] [place], [place]   (907) 106-4190 or 709-749-5964   [place] [place], [place] 984-836-6706   [facility] [address] [place], [place] 305-706-9318 Insurance/Medicaid/sponsorship through [facility] [address] [place]                                    [place], [place] 858-206-3068 [place] [address], [place] [phone]    Dr. Adele Schilder  [phone]   [facility] Dept. 1) [address] [place] 2) [place] 3)  [address], [place] (587) [phone]  [phone]   [place] (807) [phone] or [phone] (After Hours)      Take the prescriptions as directed.  Apply moist heat or ice to the area(s) of discomfort, for 15 minutes at a time, several times per day for the next few days.  Do not fall asleep on a heating or ice pack. Take over the counter decongestant, as directed on packaging, for the next week.  Use over the counter normal saline nasal spray, as instructed in the Emergency Department, several times per day for the next 2 weeks.  Call your regular medical doctor tomorrow to schedule a follow up appointment in the next 2 days.  Return to the Emergency Department immediately if worsening.

## 2014-10-09 ENCOUNTER — Encounter: Payer: Self-pay | Admitting: *Deleted

## 2014-10-21 ENCOUNTER — Ambulatory Visit (INDEPENDENT_AMBULATORY_CARE_PROVIDER_SITE_OTHER): Payer: Commercial Managed Care - HMO | Admitting: *Deleted

## 2014-10-21 DIAGNOSIS — Z7901 Long term (current) use of anticoagulants: Secondary | ICD-10-CM

## 2014-10-21 DIAGNOSIS — I4891 Unspecified atrial fibrillation: Secondary | ICD-10-CM

## 2014-10-21 DIAGNOSIS — Z5181 Encounter for therapeutic drug level monitoring: Secondary | ICD-10-CM

## 2014-10-21 LAB — POCT INR: INR: 2.3

## 2014-11-01 ENCOUNTER — Other Ambulatory Visit (HOSPITAL_COMMUNITY): Payer: Self-pay | Admitting: Family Medicine

## 2014-11-01 DIAGNOSIS — M8580 Other specified disorders of bone density and structure, unspecified site: Secondary | ICD-10-CM

## 2014-11-01 DIAGNOSIS — Z1231 Encounter for screening mammogram for malignant neoplasm of breast: Secondary | ICD-10-CM

## 2014-11-05 ENCOUNTER — Ambulatory Visit (HOSPITAL_COMMUNITY)
Admission: RE | Admit: 2014-11-05 | Discharge: 2014-11-05 | Disposition: A | Discharge status: Home / Self Care | Payer: Commercial Managed Care - HMO | Source: Ambulatory Visit | Attending: Family Medicine | Admitting: Family Medicine

## 2014-11-05 DIAGNOSIS — R29890 Loss of height: Secondary | ICD-10-CM | POA: Insufficient documentation

## 2014-11-05 DIAGNOSIS — M8580 Other specified disorders of bone density and structure, unspecified site: Secondary | ICD-10-CM | POA: Insufficient documentation

## 2014-11-05 DIAGNOSIS — Z780 Asymptomatic menopausal state: Secondary | ICD-10-CM | POA: Insufficient documentation

## 2014-11-07 ENCOUNTER — Ambulatory Visit (HOSPITAL_COMMUNITY)
Admission: RE | Admit: 2014-11-07 | Discharge: 2014-11-07 | Disposition: A | Discharge status: Home / Self Care | Payer: Commercial Managed Care - HMO | Source: Ambulatory Visit | Attending: Family Medicine | Admitting: Family Medicine

## 2014-11-07 DIAGNOSIS — Z1231 Encounter for screening mammogram for malignant neoplasm of breast: Secondary | ICD-10-CM

## 2014-12-02 ENCOUNTER — Ambulatory Visit (INDEPENDENT_AMBULATORY_CARE_PROVIDER_SITE_OTHER): Payer: Commercial Managed Care - HMO | Admitting: *Deleted

## 2014-12-02 DIAGNOSIS — I4891 Unspecified atrial fibrillation: Secondary | ICD-10-CM | POA: Diagnosis not present

## 2014-12-02 DIAGNOSIS — Z5181 Encounter for therapeutic drug level monitoring: Secondary | ICD-10-CM

## 2014-12-02 DIAGNOSIS — Z7901 Long term (current) use of anticoagulants: Secondary | ICD-10-CM | POA: Diagnosis not present

## 2014-12-02 LAB — POCT INR: INR: 5

## 2014-12-06 ENCOUNTER — Encounter (HOSPITAL_COMMUNITY): Payer: Self-pay | Admitting: *Deleted

## 2014-12-06 ENCOUNTER — Emergency Department (HOSPITAL_COMMUNITY): Payer: Commercial Managed Care - HMO

## 2014-12-06 ENCOUNTER — Emergency Department (HOSPITAL_COMMUNITY)
Admission: EM | Admit: 2014-12-06 | Discharge: 2014-12-06 | Disposition: A | Discharge status: Home / Self Care | Payer: Commercial Managed Care - HMO | Attending: Emergency Medicine | Admitting: Emergency Medicine

## 2014-12-06 DIAGNOSIS — Z7901 Long term (current) use of anticoagulants: Secondary | ICD-10-CM | POA: Diagnosis not present

## 2014-12-06 DIAGNOSIS — Z79899 Other long term (current) drug therapy: Secondary | ICD-10-CM | POA: Diagnosis not present

## 2014-12-06 DIAGNOSIS — J069 Acute upper respiratory infection, unspecified: Secondary | ICD-10-CM | POA: Insufficient documentation

## 2014-12-06 DIAGNOSIS — E780 Pure hypercholesterolemia: Secondary | ICD-10-CM | POA: Insufficient documentation

## 2014-12-06 DIAGNOSIS — Z8589 Personal history of malignant neoplasm of other organs and systems: Secondary | ICD-10-CM | POA: Insufficient documentation

## 2014-12-06 DIAGNOSIS — I10 Essential (primary) hypertension: Secondary | ICD-10-CM | POA: Diagnosis not present

## 2014-12-06 DIAGNOSIS — R05 Cough: Secondary | ICD-10-CM | POA: Diagnosis present

## 2014-12-06 MED ORDER — DOXYCYCLINE HYCLATE 100 MG PO CAPS: 100.0000 mg | ORAL_CAPSULE | Freq: Two times a day (BID) | ORAL | Status: DC

## 2014-12-06 MED ORDER — ALBUTEROL SULFATE HFA 108 (90 BASE) MCG/ACT IN AERS: 1.0000 | INHALATION_SPRAY | Freq: Four times a day (QID) | RESPIRATORY_TRACT | Status: DC | PRN

## 2014-12-06 NOTE — ED Provider Notes (Signed)
CSN: [id number]     Arrival date & time 12/06/14  6834 History   First MD Initiated Contact with Patient 12/06/14 [phone]     Chief Complaint  Patient presents with  . URI     (Consider location/radiation/quality/duration/timing/severity/associated sxs/prior Treatment) HPI.... Cough, congestion, sneezing for several days. No fever, sweats, chills, rusty sputum. Additionally, patient removed a tick from her left upper back yesterday. Severity of all symptoms mild. Substernal chest pain, dyspnea, diaphoresis  Past Medical History  Diagnosis Date  . Vitamin B 12 deficiency   . Vertigo   . High cholesterol   . Hypertension   . 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 Mandible surgery    . 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 Appendectomy    . Cervical disc surgery    . Cardiac catheterization  10/2009    normal coronary arteries   No family history on file. History  Substance Use Topics  . Smoking status: Never Smoker   . Smokeless tobacco: Never Used  . Alcohol Use: No   OB History    No data available     Review of Systems  All other systems reviewed and are negative.     Allergies  Erythromycin base  Home Medications   Prior to Admission medications   Medication Sig Start Date End Date Taking? Authorizing Provider  albuterol (PROVENTIL HFA;VENTOLIN HFA) 108 (90 BASE) MCG/ACT inhaler Inhale 1-2 puffs into the lungs every 6 (six) hours as needed for wheezing or shortness of breath. 12/06/14   Nat Christen, MD  alendronate (FOSAMAX) 70 MG tablet Take 70 mg by mouth every 7 (seven) days. Take with a full glass of water on an empty stomach. Take on Mondays    Historical Provider, MD  benzonatate (TESSALON) 100 MG capsule Take 1 capsule (100 mg total) by mouth 3 (three) times daily as needed for cough. 09/26/14   Francine Graven, DO  donepezil (ARICEPT) 5 MG tablet Take 5 mg by mouth at bedtime.    Historical  Provider, MD  doxycycline (VIBRAMYCIN) 100 MG capsule Take 1 capsule (100 mg total) by mouth 2 (two) times daily. 12/06/14   Nat Christen, MD  hydrochlorothiazide (HYDRODIURIL) 25 MG tablet Take 12.5 mg by mouth daily.    Historical Provider, MD  HYDROcodone-acetaminophen (NORCO) 7.5-325 MG per tablet Take 1 tablet by mouth every 4 (four) hours as needed for moderate pain. Patient not taking: Reported on 09/26/2014 08/14/13   Carole Civil, MD  HYDROcodone-ibuprofen (VICOPROFEN) 7.5-200 MG per tablet Take 1 tablet by mouth every 6 (six) hours as needed for moderate pain or severe pain. 09/26/14   Francine Graven, DO  losartan (COZAAR) 100 MG tablet Take 1 tablet (100 mg total) by mouth daily. 03/23/13   Herminio Commons, MD  metoprolol tartrate (LOPRESSOR) 25 MG tablet Take 25 mg by mouth 2 (two) times daily.     Historical Provider, MD  potassium chloride (KLOR-CON) 10 MEQ CR tablet Take 10 mEq by mouth daily.     Historical Provider, MD  simvastatin (ZOCOR) 40 MG tablet Take 40 mg by mouth at bedtime.     Historical Provider, MD  vitamin B-12 (CYANOCOBALAMIN) 1000 MCG tablet Take 1,000 mcg by mouth daily.      Historical Provider, MD  vitamin C (ASCORBIC ACID) 500 MG tablet Take 500 mg by mouth daily.  Historical Provider, MD  warfarin (COUMADIN) 5 MG tablet Take 1 tablet daily or as directed Patient taking differently: Take 5 mg by mouth every evening. Take 1 tablet daily or as directed 09/10/14   Herminio Commons, MD   BP 147/96 mmHg  Pulse 59  Temp(Src) 97.7 F (36.5 C) (Oral)  Resp 17  Ht 5\' 4"  (1.626 m)  Wt 160 lb (72.576 kg)  BMI 27.45 kg/m2  SpO2 100% Physical Exam  Constitutional: She is oriented to person, place, and time. She appears well-developed and well-nourished.  Nontoxic-appearing  HENT:  Head: Normocephalic and atraumatic.  Eyes: Conjunctivae and EOM are normal. Pupils are equal, round, and reactive to light.  Neck: Normal range of motion. Neck supple.   Cardiovascular: Normal rate and regular rhythm.   Pulmonary/Chest: Effort normal and breath sounds normal.  Abdominal: Soft. Bowel sounds are normal.  Musculoskeletal: Normal range of motion.  Neurological: She is alert and oriented to person, place, and time.  Skin: Skin is warm and dry.  Left upper back: Area of erythema approximately 2.5 cm in diameter. Minimal induration.  Psychiatric: She has a normal mood and affect. Her behavior is normal.  Nursing note and vitals reviewed.   ED Course  Procedures (including critical care time) Labs Review Labs Reviewed - No data to display  Imaging Review Dg Chest 2 View  12/06/2014   CLINICAL DATA:  Nonproductive cough, congestion and shortness breath  EXAM: CHEST  2 VIEW  COMPARISON:  None.  FINDINGS: Normal cardiac silhouette. Lungs are hyperinflated. No effusion, infiltrate, pneumothorax. No acute osseous abnormality. Anterior cervical fusion noted.  IMPRESSION: Hyperinflated lungs.  No acute findings.   Electronically Signed   By: Suzy Bouchard M.D.   On: 12/06/2014 08:13     EKG Interpretation   Date/Time:  Friday December 06 2014 07:27:12 EDT Ventricular Rate:  58 PR Interval:  154 QRS Duration: 98 QT Interval:  454 QTC Calculation: 446 R Axis:   -29 Text Interpretation:  Sinus rhythm Atrial premature complex Borderline  left axis deviation Confirmed by Jenicka Coxe  MD, Solana Coggin ([id number]) on 12/06/2014  7:33:57 AM      MDM   Final diagnoses:  URI (upper respiratory infection)    Patient is nontoxic-appearing. Chest x-ray negative. EKG normal. Will start doxycycline 100 mg twice a day and albuterol inhaler. She has primary care follow-up.    Nat Christen, MD 12/06/14 0830

## 2014-12-06 NOTE — ED Notes (Signed)
Pt c/o cold symptoms x 1 week including nasal congestion, non-productive cough, and sneezing at times. Pt states she removed a tick yesterday from her back. Pt states along with cold symptoms, light headedness began yesterday as well. NAD at this time.

## 2014-12-06 NOTE — Discharge Instructions (Signed)
Chest x-ray showed no obvious pneumonia. Prescription for antibiotic and inhaler. Increase fluids. Tylenol for fever. Rest. Follow-up your Dr.

## 2014-12-16 ENCOUNTER — Ambulatory Visit (INDEPENDENT_AMBULATORY_CARE_PROVIDER_SITE_OTHER): Payer: Commercial Managed Care - HMO | Admitting: *Deleted

## 2014-12-16 DIAGNOSIS — Z7901 Long term (current) use of anticoagulants: Secondary | ICD-10-CM | POA: Diagnosis not present

## 2014-12-16 DIAGNOSIS — Z5181 Encounter for therapeutic drug level monitoring: Secondary | ICD-10-CM | POA: Diagnosis not present

## 2014-12-16 DIAGNOSIS — I4891 Unspecified atrial fibrillation: Secondary | ICD-10-CM

## 2014-12-16 LAB — PROTIME-INR
INR: 6.76 (ref <1.50)
Prothrombin Time: 59.2 seconds — ABNORMAL HIGH (ref 11.6–15.2)

## 2014-12-16 LAB — POCT INR: INR: 6.76

## 2014-12-25 ENCOUNTER — Ambulatory Visit (INDEPENDENT_AMBULATORY_CARE_PROVIDER_SITE_OTHER): Payer: Commercial Managed Care - HMO | Admitting: *Deleted

## 2014-12-25 DIAGNOSIS — I4891 Unspecified atrial fibrillation: Secondary | ICD-10-CM | POA: Diagnosis not present

## 2014-12-25 DIAGNOSIS — Z7901 Long term (current) use of anticoagulants: Secondary | ICD-10-CM

## 2014-12-25 DIAGNOSIS — Z5181 Encounter for therapeutic drug level monitoring: Secondary | ICD-10-CM | POA: Diagnosis not present

## 2014-12-25 LAB — POCT INR: INR: 1.2

## 2015-01-01 ENCOUNTER — Ambulatory Visit (INDEPENDENT_AMBULATORY_CARE_PROVIDER_SITE_OTHER): Payer: Commercial Managed Care - HMO | Admitting: *Deleted

## 2015-01-01 DIAGNOSIS — I4891 Unspecified atrial fibrillation: Secondary | ICD-10-CM | POA: Diagnosis not present

## 2015-01-01 DIAGNOSIS — Z5181 Encounter for therapeutic drug level monitoring: Secondary | ICD-10-CM | POA: Diagnosis not present

## 2015-01-01 DIAGNOSIS — Z7901 Long term (current) use of anticoagulants: Secondary | ICD-10-CM

## 2015-01-01 LAB — POCT INR: INR: 1.6

## 2015-01-20 ENCOUNTER — Ambulatory Visit (INDEPENDENT_AMBULATORY_CARE_PROVIDER_SITE_OTHER): Payer: Commercial Managed Care - HMO | Admitting: *Deleted

## 2015-01-20 DIAGNOSIS — Z7901 Long term (current) use of anticoagulants: Secondary | ICD-10-CM | POA: Diagnosis not present

## 2015-01-20 DIAGNOSIS — I4891 Unspecified atrial fibrillation: Secondary | ICD-10-CM | POA: Diagnosis not present

## 2015-01-20 DIAGNOSIS — Z5181 Encounter for therapeutic drug level monitoring: Secondary | ICD-10-CM | POA: Diagnosis not present

## 2015-01-20 LAB — POCT INR: INR: 3

## 2015-02-17 ENCOUNTER — Ambulatory Visit (INDEPENDENT_AMBULATORY_CARE_PROVIDER_SITE_OTHER): Payer: Commercial Managed Care - HMO | Admitting: *Deleted

## 2015-02-17 DIAGNOSIS — Z7901 Long term (current) use of anticoagulants: Secondary | ICD-10-CM | POA: Diagnosis not present

## 2015-02-17 DIAGNOSIS — I4891 Unspecified atrial fibrillation: Secondary | ICD-10-CM

## 2015-02-17 DIAGNOSIS — Z5181 Encounter for therapeutic drug level monitoring: Secondary | ICD-10-CM | POA: Diagnosis not present

## 2015-02-17 LAB — POCT INR: INR: 3.2

## 2015-03-17 ENCOUNTER — Ambulatory Visit (INDEPENDENT_AMBULATORY_CARE_PROVIDER_SITE_OTHER): Payer: Commercial Managed Care - HMO | Admitting: *Deleted

## 2015-03-17 DIAGNOSIS — I4891 Unspecified atrial fibrillation: Secondary | ICD-10-CM

## 2015-03-17 DIAGNOSIS — Z5181 Encounter for therapeutic drug level monitoring: Secondary | ICD-10-CM

## 2015-03-17 DIAGNOSIS — Z7901 Long term (current) use of anticoagulants: Secondary | ICD-10-CM

## 2015-03-17 LAB — POCT INR: INR: 5

## 2015-03-26 ENCOUNTER — Ambulatory Visit (INDEPENDENT_AMBULATORY_CARE_PROVIDER_SITE_OTHER): Payer: Commercial Managed Care - HMO | Admitting: *Deleted

## 2015-03-26 DIAGNOSIS — Z7901 Long term (current) use of anticoagulants: Secondary | ICD-10-CM

## 2015-03-26 DIAGNOSIS — Z5181 Encounter for therapeutic drug level monitoring: Secondary | ICD-10-CM

## 2015-03-26 DIAGNOSIS — I4891 Unspecified atrial fibrillation: Secondary | ICD-10-CM | POA: Diagnosis not present

## 2015-03-26 LAB — POCT INR: INR: 1.6

## 2015-04-02 ENCOUNTER — Ambulatory Visit (INDEPENDENT_AMBULATORY_CARE_PROVIDER_SITE_OTHER): Payer: Commercial Managed Care - HMO | Admitting: *Deleted

## 2015-04-02 DIAGNOSIS — I4891 Unspecified atrial fibrillation: Secondary | ICD-10-CM

## 2015-04-02 DIAGNOSIS — Z7901 Long term (current) use of anticoagulants: Secondary | ICD-10-CM | POA: Diagnosis not present

## 2015-04-02 DIAGNOSIS — Z5181 Encounter for therapeutic drug level monitoring: Secondary | ICD-10-CM

## 2015-04-02 LAB — POCT INR: INR: 3.6

## 2015-04-21 ENCOUNTER — Ambulatory Visit (INDEPENDENT_AMBULATORY_CARE_PROVIDER_SITE_OTHER): Payer: Commercial Managed Care - HMO | Admitting: *Deleted

## 2015-04-21 DIAGNOSIS — I4891 Unspecified atrial fibrillation: Secondary | ICD-10-CM | POA: Diagnosis not present

## 2015-04-21 DIAGNOSIS — Z5181 Encounter for therapeutic drug level monitoring: Secondary | ICD-10-CM | POA: Diagnosis not present

## 2015-04-21 DIAGNOSIS — Z7901 Long term (current) use of anticoagulants: Secondary | ICD-10-CM | POA: Diagnosis not present

## 2015-04-21 LAB — POCT INR: INR: 3.7

## 2015-05-12 ENCOUNTER — Ambulatory Visit (INDEPENDENT_AMBULATORY_CARE_PROVIDER_SITE_OTHER): Payer: Commercial Managed Care - HMO | Admitting: *Deleted

## 2015-05-12 DIAGNOSIS — Z7901 Long term (current) use of anticoagulants: Secondary | ICD-10-CM | POA: Diagnosis not present

## 2015-05-12 DIAGNOSIS — Z5181 Encounter for therapeutic drug level monitoring: Secondary | ICD-10-CM

## 2015-05-12 DIAGNOSIS — I4891 Unspecified atrial fibrillation: Secondary | ICD-10-CM

## 2015-05-12 LAB — POCT INR: INR: 2.1

## 2015-06-05 ENCOUNTER — Telehealth: Payer: Self-pay

## 2015-06-05 NOTE — Telephone Encounter (Signed)
PT DAUGHTER TERESA LOVELACE STATED TO CALL HER TO SCHEDULE TCS THAT PATIENT HAS DEMENTIA   [phone]

## 2015-06-06 NOTE — Telephone Encounter (Signed)
Pt is past due for colonoscopy. Has diarrhea frequently. On coumadin.  OV with Neil Crouch, PA on 06/30/2015 @ 10:30 Am.

## 2015-06-09 ENCOUNTER — Ambulatory Visit (INDEPENDENT_AMBULATORY_CARE_PROVIDER_SITE_OTHER): Payer: Commercial Managed Care - HMO | Admitting: *Deleted

## 2015-06-09 DIAGNOSIS — Z7901 Long term (current) use of anticoagulants: Secondary | ICD-10-CM

## 2015-06-09 DIAGNOSIS — Z5181 Encounter for therapeutic drug level monitoring: Secondary | ICD-10-CM

## 2015-06-09 DIAGNOSIS — I4891 Unspecified atrial fibrillation: Secondary | ICD-10-CM

## 2015-06-09 LAB — POCT INR: INR: 1.1

## 2015-06-16 ENCOUNTER — Ambulatory Visit (INDEPENDENT_AMBULATORY_CARE_PROVIDER_SITE_OTHER): Payer: Commercial Managed Care - HMO | Admitting: *Deleted

## 2015-06-16 DIAGNOSIS — Z7901 Long term (current) use of anticoagulants: Secondary | ICD-10-CM | POA: Diagnosis not present

## 2015-06-16 DIAGNOSIS — Z5181 Encounter for therapeutic drug level monitoring: Secondary | ICD-10-CM | POA: Diagnosis not present

## 2015-06-16 DIAGNOSIS — I4891 Unspecified atrial fibrillation: Secondary | ICD-10-CM

## 2015-06-16 LAB — POCT INR: INR: 1.9

## 2015-06-30 ENCOUNTER — Other Ambulatory Visit: Payer: Self-pay

## 2015-06-30 ENCOUNTER — Encounter: Payer: Self-pay | Admitting: Gastroenterology

## 2015-06-30 ENCOUNTER — Ambulatory Visit (INDEPENDENT_AMBULATORY_CARE_PROVIDER_SITE_OTHER): Payer: Commercial Managed Care - HMO | Admitting: Gastroenterology

## 2015-06-30 ENCOUNTER — Telehealth: Payer: Self-pay | Admitting: Gastroenterology

## 2015-06-30 VITALS — BP 110/56 | HR 50 | Temp 98.0°F | Ht 62.0 in | Wt 145.0 lb

## 2015-06-30 DIAGNOSIS — Z800 Family history of malignant neoplasm of digestive organs: Secondary | ICD-10-CM | POA: Diagnosis not present

## 2015-06-30 DIAGNOSIS — R197 Diarrhea, unspecified: Secondary | ICD-10-CM

## 2015-06-30 DIAGNOSIS — Z7901 Long term (current) use of anticoagulants: Secondary | ICD-10-CM | POA: Diagnosis not present

## 2015-06-30 MED ORDER — PEG 3350-KCL-NA BICARB-NACL 420 G PO SOLR: 4000.0000 mL | Freq: Once | ORAL | Status: DC

## 2015-06-30 NOTE — Progress Notes (Signed)
Primary Care Physician:  Robert Bellow, MD  Primary Gastroenterologist:  Garfield Cornea, MD   Chief Complaint  Patient presents with  . Diarrhea    time to schedule colonoscopy    HPI:  Jaclyn Schultz is a 81 y.o. female here at the request of Dr. Karie Kirks for consideration of colonoscopy. Her last colonoscopy in September 2005. Internal hemorrhoids, sigmoid diverticula noted. Family history of colon cancer, sister in her 30s. Succumb to the disease. She was due for follow-up in 2010.  Clinically patient is doing well. She has intermittent diarrhea which lasts for couple of days at a time. She may go several days or even a couple of weeks without any problems followed by a couple of days of loose stools. Occuring chronically. No associated modifying or alleviating factors.No blood in the stool or melena. Her appetite is good. No unintentional weight loss. Denies any dysphagia, vomiting, heartburn. She presents with her granddaughter today. Patient has some mild memory issues and is on aricept. Still signing for herself. Appears appropriate today.  Current Outpatient Prescriptions  Medication Sig Dispense Refill  . alendronate (FOSAMAX) 70 MG tablet Take 70 mg by mouth every 7 (seven) days. Take with a full glass of water on an empty stomach. Take on Mondays    . donepezil (ARICEPT) 5 MG tablet Take 5 mg by mouth at bedtime.    . hydrochlorothiazide (HYDRODIURIL) 25 MG tablet Take 12.5 mg by mouth daily.    Marland Kitchen losartan (COZAAR) 100 MG tablet Take 1 tablet (100 mg total) by mouth daily. 90 tablet 3  . metoprolol tartrate (LOPRESSOR) 25 MG tablet Take 25 mg by mouth 2 (two) times daily.     . potassium chloride (KLOR-CON) 10 MEQ CR tablet Take 10 mEq by mouth daily.     . simvastatin (ZOCOR) 40 MG tablet Take 40 mg by mouth at bedtime.     Marland Kitchen warfarin (COUMADIN) 5 MG tablet Take 1 tablet daily or as directed (Patient taking differently: Take 5 mg by mouth every evening. Take 1 tablet daily or  as directed) 30 tablet 3  . polyethylene glycol-electrolytes (NULYTELY/GOLYTELY) 420 G solution Take 4,000 mLs by mouth once. 4000 mL 0   No current facility-administered medications for this visit.    Allergies as of 06/30/2015 - Review Complete 06/30/2015  Allergen Reaction Noted  . Erythromycin base Rash 04/19/2011    Past Medical History  Diagnosis Date  . Vitamin B 12 deficiency   . Vertigo   . High cholesterol   . Hypertension   . Cancer ([place])     mouth/dx 08-2010/surg only  . A-fib ([place])   . Osteoporosis   . Dementia     Early    Past Surgical History  Procedure Laterality Date  . Mandible surgery    . 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 Appendectomy    . Cervical disc surgery    . Cardiac catheterization  10/2009    normal coronary arteries  . Colonoscopy  05/27/2004    [id number] hemorrhoids/sigmoid diverticula    Family History  Problem Relation Age of Onset  . Colon cancer Sister     age 60    Social History   Social History  . Marital Status: Widowed    Spouse Name: N/A  . Number of Children: N/A  . Years of Education: N/A   Occupational History  . Not on file.   Social History Main Topics  . Smoking  status: Never Smoker   . Smokeless tobacco: Never Used     Comment: Never smoked  . Alcohol Use: No  . Drug Use: No  . Sexual Activity: No   Other Topics Concern  . Not on file   Social History Narrative      ROS:  General: Negative for anorexia, weight loss, fever, chills, fatigue, weakness. Eyes: Negative for vision changes.  ENT: Negative for hoarseness, difficulty swallowing , nasal congestion. CV: Negative for chest pain, angina, palpitations, dyspnea on exertion, peripheral edema.  Respiratory: Negative for dyspnea at rest, dyspnea on exertion, cough, sputum, wheezing.  GI: See history of present illness. GU:  Negative for dysuria, hematuria, urinary incontinence, urinary frequency, nocturnal urination.  MS:  Negative for joint pain, low back pain.  Derm: Negative for rash or itching.  Neuro: Negative for weakness, abnormal sensation, seizure, frequent headaches,  confusion. See history of present illness Psych: Negative for anxiety, depression, suicidal ideation, hallucinations.  Endo: Negative for unusual weight change.  Heme: Negative for bruising or bleeding. Allergy: Negative for rash or hives.    Physical Examination:  BP 110/56 mmHg  Pulse 50  Temp(Src) 98 F (36.7 C) (Oral)  Ht 5\' 2"  (1.575 m)  Wt 145 lb (65.772 kg)  BMI 26.51 kg/m2   General: Well-nourished, well-developed in no acute distress.  Head: Normocephalic, atraumatic.   Eyes: Conjunctiva pink, no icterus. Mouth: Oropharyngeal mucosa moist and pink , no lesions erythema or exudate. Neck: Supple without thyromegaly, masses, or lymphadenopathy.  Lungs: Clear to auscultation bilaterally.  Heart: Regular rate and rhythm, no murmurs rubs or gallops.  Abdomen: Bowel sounds are normal, nontender, nondistended, no hepatosplenomegaly or masses, no abdominal bruits or    hernia , no rebound or guarding.   Rectal: deferred Extremities: No lower extremity edema. No clubbing or deformities.  Neuro: Alert and oriented x 4 , grossly normal neurologically.  Skin: Warm and dry, no rash or jaundice.   Psych: Alert and cooperative, normal mood and affect.  Labs: Lab Results  Component Value Date   CREATININE 1.16* 09/26/2014   BUN 24* 09/26/2014   NA 139 09/26/2014   K 3.8 09/26/2014   CL 104 09/26/2014   CO2 25 09/26/2014   Lab Results  Component Value Date   ALT 17 09/26/2014   AST 24 09/26/2014   ALKPHOS 70 09/26/2014   BILITOT 0.9 09/26/2014   Lab Results  Component Value Date   WBC 7.7 09/26/2014   HGB 15.0 09/26/2014   HCT 44.6 09/26/2014   MCV 93.3 09/26/2014   PLT 198 09/26/2014   Lab Results  Component Value Date   INR 1.9 06/16/2015   INR 1.1 06/09/2015   INR 2.1 05/12/2015     Imaging  Studies: No results found.

## 2015-06-30 NOTE — Patient Instructions (Signed)
1. Colonoscopy with Dr. Rourk. See separate instructions.  

## 2015-06-30 NOTE — Telephone Encounter (Signed)
Patient has upcoming colonoscopy with biopsies planned. We have advised her to hold her Coumadin 4 days prior to procedure. Please let us know if you have any further recommendations i.e. any requirement for Lovenox bridging.

## 2015-06-30 NOTE — Assessment & Plan Note (Signed)
81 year old female with family history of colon cancer, sister died in her 23s from the disease, who presents for consideration of high risk screening colonoscopy. Patient reports intermittent diarrhea with periods of normalcy, no other alarm symptoms. Etiology unclear. Consider microscopic colitis. Doubt infectious colitis.  Colonoscopy plus or minus random colon biopsies in the near future. I have discussed the risks, alternatives, benefits with regards to but not limited to the risk of reaction to medication, bleeding, infection, perforation and the patient is agreeable to proceed. Written consent to be obtained.Patient has some memory issues intermittently. Encouraged family to be present and assist with bowel preparation to ensure done properly especially with clear liquid diet. Voiced understanding.   Plan on holding Coumadin for 4 days prior to procedure. We will make her cardiologist aware.

## 2015-06-30 NOTE — Progress Notes (Signed)
CC'ED TO PCP 

## 2015-06-30 NOTE — Telephone Encounter (Signed)
That will be fine.  No bridging needed.   Thanks

## 2015-07-01 NOTE — Progress Notes (Signed)
Discussed with cardiology. No need for lovenox bridging. See separate telephone note.

## 2015-07-08 ENCOUNTER — Ambulatory Visit (HOSPITAL_COMMUNITY)
Admission: RE | Admit: 2015-07-08 | Discharge: 2015-07-08 | Disposition: A | Discharge status: Home / Self Care | Payer: Commercial Managed Care - HMO | Source: Ambulatory Visit | Attending: Internal Medicine | Admitting: Internal Medicine

## 2015-07-08 ENCOUNTER — Encounter (HOSPITAL_COMMUNITY)
Admission: RE | Disposition: A | Discharge status: Home / Self Care | Payer: Self-pay | Source: Ambulatory Visit | Attending: Internal Medicine

## 2015-07-08 ENCOUNTER — Encounter (HOSPITAL_COMMUNITY): Payer: Self-pay | Admitting: *Deleted

## 2015-07-08 DIAGNOSIS — Z85818 Personal history of malignant neoplasm of other sites of lip, oral cavity, and pharynx: Secondary | ICD-10-CM | POA: Diagnosis not present

## 2015-07-08 DIAGNOSIS — Z79899 Other long term (current) drug therapy: Secondary | ICD-10-CM | POA: Insufficient documentation

## 2015-07-08 DIAGNOSIS — I10 Essential (primary) hypertension: Secondary | ICD-10-CM | POA: Insufficient documentation

## 2015-07-08 DIAGNOSIS — K624 Stenosis of anus and rectum: Secondary | ICD-10-CM | POA: Diagnosis not present

## 2015-07-08 DIAGNOSIS — R197 Diarrhea, unspecified: Secondary | ICD-10-CM | POA: Diagnosis present

## 2015-07-08 DIAGNOSIS — Z7901 Long term (current) use of anticoagulants: Secondary | ICD-10-CM | POA: Diagnosis not present

## 2015-07-08 DIAGNOSIS — K649 Unspecified hemorrhoids: Secondary | ICD-10-CM | POA: Diagnosis not present

## 2015-07-08 DIAGNOSIS — Z800 Family history of malignant neoplasm of digestive organs: Secondary | ICD-10-CM | POA: Diagnosis not present

## 2015-07-08 DIAGNOSIS — E7800 Pure hypercholesterolemia, unspecified: Secondary | ICD-10-CM | POA: Diagnosis not present

## 2015-07-08 HISTORY — PX: COLONOSCOPY: SHX5424

## 2015-07-08 HISTORY — PX: BIOPSY: SHX5522

## 2015-07-08 SURGERY — COLONOSCOPY
Anesthesia: Moderate Sedation

## 2015-07-08 MED ORDER — MEPERIDINE HCL 100 MG/ML IJ SOLN
INTRAMUSCULAR | Status: DC | PRN
  Administered 2015-07-08: 50 mg via INTRAVENOUS

## 2015-07-08 MED ORDER — ONDANSETRON HCL 4 MG/2ML IJ SOLN
INTRAMUSCULAR | Status: AC
  Filled 2015-07-08: qty 2

## 2015-07-08 MED ORDER — MIDAZOLAM HCL 5 MG/5ML IJ SOLN
INTRAMUSCULAR | Status: DC | PRN
  Administered 2015-07-08 (×2): 1 mg via INTRAVENOUS
  Administered 2015-07-08: 2 mg via INTRAVENOUS

## 2015-07-08 MED ORDER — MIDAZOLAM HCL 5 MG/5ML IJ SOLN
INTRAMUSCULAR | Status: AC
  Filled 2015-07-08: qty 10

## 2015-07-08 MED ORDER — ONDANSETRON HCL 4 MG/2ML IJ SOLN
INTRAMUSCULAR | Status: DC | PRN
  Administered 2015-07-08: 4 mg via INTRAVENOUS

## 2015-07-08 MED ORDER — MEPERIDINE HCL 100 MG/ML IJ SOLN
INTRAMUSCULAR | Status: AC
  Filled 2015-07-08: qty 2

## 2015-07-08 MED ORDER — SODIUM CHLORIDE 0.9 % IV SOLN
INTRAVENOUS | Status: DC
  Administered 2015-07-08: 08:00:00 via INTRAVENOUS

## 2015-07-08 MED ORDER — STERILE WATER FOR IRRIGATION IR SOLN
Status: DC | PRN
  Administered 2015-07-08: 09:00:00

## 2015-07-08 NOTE — H&P (View-Only) (Signed)
Primary Care Physician:  Robert Bellow, MD  Primary Gastroenterologist:  Garfield Cornea, MD   Chief Complaint  Patient presents with  . Diarrhea    time to schedule colonoscopy    HPI:  Jaclyn Schultz is a 81 y.o. female here at the request of Dr. Karie Kirks for consideration of colonoscopy. Her last colonoscopy in September 2005. Internal hemorrhoids, sigmoid diverticula noted. Family history of colon cancer, sister in her 24s. Succumb to the disease. She was due for follow-up in 2010.  Clinically patient is doing well. She has intermittent diarrhea which lasts for couple of days at a time. She may go several days or even a couple of weeks without any problems followed by a couple of days of loose stools. Occuring chronically. No associated modifying or alleviating factors.No blood in the stool or melena. Her appetite is good. No unintentional weight loss. Denies any dysphagia, vomiting, heartburn. She presents with her granddaughter today. Patient has some mild memory issues and is on aricept. Still signing for herself. Appears appropriate today.  Current Outpatient Prescriptions  Medication Sig Dispense Refill  . alendronate (FOSAMAX) 70 MG tablet Take 70 mg by mouth every 7 (seven) days. Take with a full glass of water on an empty stomach. Take on Mondays    . donepezil (ARICEPT) 5 MG tablet Take 5 mg by mouth at bedtime.    . hydrochlorothiazide (HYDRODIURIL) 25 MG tablet Take 12.5 mg by mouth daily.    Marland Kitchen losartan (COZAAR) 100 MG tablet Take 1 tablet (100 mg total) by mouth daily. 90 tablet 3  . metoprolol tartrate (LOPRESSOR) 25 MG tablet Take 25 mg by mouth 2 (two) times daily.     . potassium chloride (KLOR-CON) 10 MEQ CR tablet Take 10 mEq by mouth daily.     . simvastatin (ZOCOR) 40 MG tablet Take 40 mg by mouth at bedtime.     Marland Kitchen warfarin (COUMADIN) 5 MG tablet Take 1 tablet daily or as directed (Patient taking differently: Take 5 mg by mouth every evening. Take 1 tablet daily or  as directed) 30 tablet 3  . polyethylene glycol-electrolytes (NULYTELY/GOLYTELY) 420 G solution Take 4,000 mLs by mouth once. 4000 mL 0   No current facility-administered medications for this visit.    Allergies as of 06/30/2015 - Review Complete 06/30/2015  Allergen Reaction Noted  . Erythromycin base Rash 04/19/2011    Past Medical History  Diagnosis Date  . Vitamin B 12 deficiency   . Vertigo   . High cholesterol   . Hypertension   . Cancer ([place])     mouth/dx 08-2010/surg only  . A-fib ([place])   . Osteoporosis   . Dementia     Early    Past Surgical History  Procedure Laterality Date  . Mandible surgery    . 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 Appendectomy    . Cervical disc surgery    . Cardiac catheterization  10/2009    normal coronary arteries  . Colonoscopy  05/27/2004    [id number] hemorrhoids/sigmoid diverticula    Family History  Problem Relation Age of Onset  . Colon cancer Sister     age 71    Social History   Social History  . Marital Status: Widowed    Spouse Name: N/A  . Number of Children: N/A  . Years of Education: N/A   Occupational History  . Not on file.   Social History Main Topics  . Smoking  status: Never Smoker   . Smokeless tobacco: Never Used     Comment: Never smoked  . Alcohol Use: No  . Drug Use: No  . Sexual Activity: No   Other Topics Concern  . Not on file   Social History Narrative      ROS:  General: Negative for anorexia, weight loss, fever, chills, fatigue, weakness. Eyes: Negative for vision changes.  ENT: Negative for hoarseness, difficulty swallowing , nasal congestion. CV: Negative for chest pain, angina, palpitations, dyspnea on exertion, peripheral edema.  Respiratory: Negative for dyspnea at rest, dyspnea on exertion, cough, sputum, wheezing.  GI: See history of present illness. GU:  Negative for dysuria, hematuria, urinary incontinence, urinary frequency, nocturnal urination.  MS:  Negative for joint pain, low back pain.  Derm: Negative for rash or itching.  Neuro: Negative for weakness, abnormal sensation, seizure, frequent headaches,  confusion. See history of present illness Psych: Negative for anxiety, depression, suicidal ideation, hallucinations.  Endo: Negative for unusual weight change.  Heme: Negative for bruising or bleeding. Allergy: Negative for rash or hives.    Physical Examination:  BP 110/56 mmHg  Pulse 50  Temp(Src) 98 F (36.7 C) (Oral)  Ht 5\' 2"  (1.575 m)  Wt 145 lb (65.772 kg)  BMI 26.51 kg/m2   General: Well-nourished, well-developed in no acute distress.  Head: Normocephalic, atraumatic.   Eyes: Conjunctiva pink, no icterus. Mouth: Oropharyngeal mucosa moist and pink , no lesions erythema or exudate. Neck: Supple without thyromegaly, masses, or lymphadenopathy.  Lungs: Clear to auscultation bilaterally.  Heart: Regular rate and rhythm, no murmurs rubs or gallops.  Abdomen: Bowel sounds are normal, nontender, nondistended, no hepatosplenomegaly or masses, no abdominal bruits or    hernia , no rebound or guarding.   Rectal: deferred Extremities: No lower extremity edema. No clubbing or deformities.  Neuro: Alert and oriented x 4 , grossly normal neurologically.  Skin: Warm and dry, no rash or jaundice.   Psych: Alert and cooperative, normal mood and affect.  Labs: Lab Results  Component Value Date   CREATININE 1.16* 09/26/2014   BUN 24* 09/26/2014   NA 139 09/26/2014   K 3.8 09/26/2014   CL 104 09/26/2014   CO2 25 09/26/2014   Lab Results  Component Value Date   ALT 17 09/26/2014   AST 24 09/26/2014   ALKPHOS 70 09/26/2014   BILITOT 0.9 09/26/2014   Lab Results  Component Value Date   WBC 7.7 09/26/2014   HGB 15.0 09/26/2014   HCT 44.6 09/26/2014   MCV 93.3 09/26/2014   PLT 198 09/26/2014   Lab Results  Component Value Date   INR 1.9 06/16/2015   INR 1.1 06/09/2015   INR 2.1 05/12/2015     Imaging  Studies: No results found.

## 2015-07-08 NOTE — Discharge Instructions (Signed)

## 2015-07-08 NOTE — Interval H&P Note (Signed)
History and Physical Interval Note:  07/08/2015 8:36 AM  Jaclyn Schultz  has presented today for surgery, with the diagnosis of diarrhea, family history of colon cancer  The various methods of treatment have been discussed with the patient and family. After consideration of risks, benefits and other options for treatment, the patient has consented to  Procedure(s) with comments: COLONOSCOPY (N/A) - 0815 BIOPSY (N/A) - possible random colon biopsies as a surgical intervention .  The patient's history has been reviewed, patient examined, no change in status, stable for surgery.  I have reviewed the patient's chart and labs.  Questions were answered to the patient's satisfaction.     Jaclyn Schultz  No change. Diagnostic colonoscopy per plan.The risks, benefits, limitations, alternatives and imponderables have been reviewed with the patient. Questions have been answered. All parties are agreeable.

## 2015-07-08 NOTE — Op Note (Signed)
[facility] [address] [place], [address]   COLONOSCOPY PROCEDURE REPORT  PATIENT: Jaclyn Schultz, Jaclyn Schultz  MR#: [id number] BIRTHDATE: 1934/02/23 , 18  yrs. old GENDER: female ENDOSCOPIST: R.  Garfield Cornea, MD FACP [facility] REFERRED QT:MAUQJ Karie Kirks, M.D. PROCEDURE DATE:  07-21-15 PROCEDURE:   Ileo-colonoscopy with biopsy INDICATIONS:    Chronic diarrhea.; Positive family history of colon cancer.  MEDICATIONS: Versed 4 mg IV and Demerol 50 mg IV in divided doses. Zofran 4 mg IV. ASA CLASS:       Class II  CONSENT: The risks, benefits, alternatives and imponderables including but not limited to bleeding, perforation as well as the possibility of a missed lesion have been reviewed.  The potential for biopsy, lesion removal, etc. have also been discussed. Questions have been answered.  All parties agreeable.  Please see the history and physical in the medical record for more information.  DESCRIPTION OF PROCEDURE:   After the risks benefits and alternatives of the procedure were thoroughly explained, informed consent was obtained.  The digital rectal exam revealed moderate anal stenosis.        The EC-[id number] ([id number])  endoscope was introduced through the anus and advanced to the terminal ileum.     . No adverse events experienced.   The quality of the prep was adequate       The instrument was then slowly withdrawn as the colon was fully examined. Estimated blood loss is zero unless otherwise noted in this procedure report.    Mild anal stenosis found on DRE. Anal papilla and internal hemorrhoids; otherwise normal rectum. Rectal vault to small to retroflex. Mucosa seen well on?"face.       .      . Normal-appearing colonic mucosa. The distal 10 cm of terminal ileal mucosa also appeared normal. Segmental biopsies ascending as well as the descending/sigmoid segments taken for histologic study.  Withdrawal time=9 minutes 0 seconds.  The scope was withdrawn  and the procedure completed. COMPLICATIONS: There were no immediate complications. EBL 3 mL ENDOSCOPIC IMPRESSION: Normal-appearing rectum and colon. Status post segmental biopsy   RECOMMENDATIONS: Follow-up on pathology. Further recommendations to follow. As far as colorectal cancer screening is concerned, I do not recommend a future examination unless patient develops new symptoms.  eSigned:  R. Garfield Cornea, MD Rosalita Chessman [facility] Jul 21, 2015 9:16 AM   cc:  CPT CODES: ICD CODES:  The ICD and CPT codes recommended by this software are interpretations from the data that the clinical staff has captured with the software.  The verification of the translation of this report to the ICD and CPT codes and modifiers is the sole responsibility of the health care institution and practicing physician where this report was generated.  [employer] will not be held responsible for the validity of the ICD and CPT codes included on this report.  AMA assumes no liability for data contained or not contained herein. CPT is a Designer, television/film set of the [employer].

## 2015-07-09 ENCOUNTER — Encounter: Payer: Self-pay | Admitting: Internal Medicine

## 2015-07-10 ENCOUNTER — Encounter: Payer: Self-pay | Admitting: Internal Medicine

## 2015-07-14 ENCOUNTER — Encounter (HOSPITAL_COMMUNITY): Payer: Self-pay | Admitting: Internal Medicine

## 2015-07-16 ENCOUNTER — Ambulatory Visit (INDEPENDENT_AMBULATORY_CARE_PROVIDER_SITE_OTHER): Payer: Commercial Managed Care - HMO | Admitting: *Deleted

## 2015-07-16 DIAGNOSIS — Z5181 Encounter for therapeutic drug level monitoring: Secondary | ICD-10-CM | POA: Diagnosis not present

## 2015-07-16 DIAGNOSIS — I4891 Unspecified atrial fibrillation: Secondary | ICD-10-CM

## 2015-07-16 DIAGNOSIS — Z7901 Long term (current) use of anticoagulants: Secondary | ICD-10-CM | POA: Diagnosis not present

## 2015-07-16 LAB — POCT INR: INR: 1.3

## 2015-07-23 ENCOUNTER — Ambulatory Visit (INDEPENDENT_AMBULATORY_CARE_PROVIDER_SITE_OTHER): Payer: Commercial Managed Care - HMO | Admitting: *Deleted

## 2015-07-23 DIAGNOSIS — Z7901 Long term (current) use of anticoagulants: Secondary | ICD-10-CM

## 2015-07-23 DIAGNOSIS — Z5181 Encounter for therapeutic drug level monitoring: Secondary | ICD-10-CM | POA: Diagnosis not present

## 2015-07-23 DIAGNOSIS — I4891 Unspecified atrial fibrillation: Secondary | ICD-10-CM | POA: Diagnosis not present

## 2015-07-23 LAB — POCT INR: INR: 2.6

## 2015-08-11 ENCOUNTER — Ambulatory Visit: Payer: Commercial Managed Care - HMO | Admitting: Gastroenterology

## 2015-08-13 ENCOUNTER — Ambulatory Visit (INDEPENDENT_AMBULATORY_CARE_PROVIDER_SITE_OTHER): Payer: Commercial Managed Care - HMO | Admitting: *Deleted

## 2015-08-13 DIAGNOSIS — I4891 Unspecified atrial fibrillation: Secondary | ICD-10-CM

## 2015-08-13 DIAGNOSIS — Z7901 Long term (current) use of anticoagulants: Secondary | ICD-10-CM | POA: Diagnosis not present

## 2015-08-13 DIAGNOSIS — Z5181 Encounter for therapeutic drug level monitoring: Secondary | ICD-10-CM | POA: Diagnosis not present

## 2015-08-13 LAB — POCT INR: INR: 2.9

## 2015-09-17 ENCOUNTER — Ambulatory Visit (INDEPENDENT_AMBULATORY_CARE_PROVIDER_SITE_OTHER): Payer: Commercial Managed Care - HMO | Admitting: Pharmacist

## 2015-09-17 DIAGNOSIS — Z5181 Encounter for therapeutic drug level monitoring: Secondary | ICD-10-CM | POA: Diagnosis not present

## 2015-09-17 DIAGNOSIS — Z7901 Long term (current) use of anticoagulants: Secondary | ICD-10-CM | POA: Diagnosis not present

## 2015-09-17 DIAGNOSIS — I4891 Unspecified atrial fibrillation: Secondary | ICD-10-CM

## 2015-09-17 LAB — POCT INR: INR: 1.9

## 2015-10-08 ENCOUNTER — Ambulatory Visit (INDEPENDENT_AMBULATORY_CARE_PROVIDER_SITE_OTHER): Payer: Commercial Managed Care - HMO | Admitting: *Deleted

## 2015-10-08 DIAGNOSIS — Z5181 Encounter for therapeutic drug level monitoring: Secondary | ICD-10-CM

## 2015-10-08 DIAGNOSIS — I4891 Unspecified atrial fibrillation: Secondary | ICD-10-CM | POA: Diagnosis not present

## 2015-10-08 DIAGNOSIS — Z7901 Long term (current) use of anticoagulants: Secondary | ICD-10-CM | POA: Diagnosis not present

## 2015-10-08 LAB — POCT INR: INR: 3

## 2015-11-05 ENCOUNTER — Ambulatory Visit (INDEPENDENT_AMBULATORY_CARE_PROVIDER_SITE_OTHER): Payer: Commercial Managed Care - HMO | Admitting: Pharmacist

## 2015-11-05 DIAGNOSIS — Z7901 Long term (current) use of anticoagulants: Secondary | ICD-10-CM | POA: Diagnosis not present

## 2015-11-05 DIAGNOSIS — Z5181 Encounter for therapeutic drug level monitoring: Secondary | ICD-10-CM

## 2015-11-05 DIAGNOSIS — I4891 Unspecified atrial fibrillation: Secondary | ICD-10-CM

## 2015-11-05 LAB — POCT INR: INR: 1.6

## 2015-11-19 ENCOUNTER — Ambulatory Visit (INDEPENDENT_AMBULATORY_CARE_PROVIDER_SITE_OTHER): Payer: Commercial Managed Care - HMO | Admitting: *Deleted

## 2015-11-19 DIAGNOSIS — Z7901 Long term (current) use of anticoagulants: Secondary | ICD-10-CM

## 2015-11-19 DIAGNOSIS — I4891 Unspecified atrial fibrillation: Secondary | ICD-10-CM

## 2015-11-19 DIAGNOSIS — Z5181 Encounter for therapeutic drug level monitoring: Secondary | ICD-10-CM

## 2015-11-19 LAB — POCT INR: INR: 2.8

## 2015-11-27 ENCOUNTER — Other Ambulatory Visit (HOSPITAL_COMMUNITY): Payer: Self-pay | Admitting: Family Medicine

## 2015-11-27 DIAGNOSIS — Z1231 Encounter for screening mammogram for malignant neoplasm of breast: Secondary | ICD-10-CM

## 2015-12-10 ENCOUNTER — Ambulatory Visit (INDEPENDENT_AMBULATORY_CARE_PROVIDER_SITE_OTHER): Payer: Commercial Managed Care - HMO | Admitting: *Deleted

## 2015-12-10 DIAGNOSIS — I4891 Unspecified atrial fibrillation: Secondary | ICD-10-CM

## 2015-12-10 DIAGNOSIS — Z7901 Long term (current) use of anticoagulants: Secondary | ICD-10-CM | POA: Diagnosis not present

## 2015-12-10 DIAGNOSIS — Z5181 Encounter for therapeutic drug level monitoring: Secondary | ICD-10-CM

## 2015-12-10 LAB — POCT INR: INR: 2.9

## 2015-12-15 ENCOUNTER — Ambulatory Visit (HOSPITAL_COMMUNITY): Payer: Commercial Managed Care - HMO

## 2015-12-15 ENCOUNTER — Ambulatory Visit (INDEPENDENT_AMBULATORY_CARE_PROVIDER_SITE_OTHER): Payer: Commercial Managed Care - HMO | Admitting: *Deleted

## 2015-12-15 ENCOUNTER — Telehealth: Payer: Self-pay | Admitting: *Deleted

## 2015-12-15 DIAGNOSIS — Z5181 Encounter for therapeutic drug level monitoring: Secondary | ICD-10-CM

## 2015-12-15 DIAGNOSIS — Z7901 Long term (current) use of anticoagulants: Secondary | ICD-10-CM

## 2015-12-15 LAB — POCT INR: INR: 3.1

## 2015-12-15 NOTE — Telephone Encounter (Signed)
See coumadin note. 

## 2015-12-15 NOTE — Telephone Encounter (Signed)
Pt's daughter Jaclyn Schultz called and stated that Dr. Karie Kirks had ordered labs on Jaclyn Schultz and that her INR was 3.1 and wanted her to let you know

## 2015-12-25 ENCOUNTER — Ambulatory Visit (HOSPITAL_COMMUNITY): Payer: Commercial Managed Care - HMO

## 2016-01-07 ENCOUNTER — Encounter: Payer: Self-pay | Admitting: *Deleted

## 2016-01-12 ENCOUNTER — Telehealth: Payer: Self-pay | Admitting: *Deleted

## 2016-01-12 NOTE — Telephone Encounter (Signed)
Pt had surgery for throat cancer on 01/06/16.  She went off coumadin 5/3 and restarts tonight 5/15.  INR appt made for 01/21/16.

## 2016-01-12 NOTE — Telephone Encounter (Signed)
Pt has had surgery and been off her coumadin, pls call and let her know when she needs to be seen again

## 2016-01-19 ENCOUNTER — Encounter: Payer: Self-pay | Admitting: Radiation Oncology

## 2016-01-21 ENCOUNTER — Ambulatory Visit (INDEPENDENT_AMBULATORY_CARE_PROVIDER_SITE_OTHER): Payer: Commercial Managed Care - HMO | Admitting: *Deleted

## 2016-01-21 DIAGNOSIS — Z7901 Long term (current) use of anticoagulants: Secondary | ICD-10-CM | POA: Diagnosis not present

## 2016-01-21 DIAGNOSIS — I4891 Unspecified atrial fibrillation: Secondary | ICD-10-CM

## 2016-01-21 DIAGNOSIS — Z5181 Encounter for therapeutic drug level monitoring: Secondary | ICD-10-CM | POA: Diagnosis not present

## 2016-01-21 LAB — POCT INR: INR: 2.1

## 2016-01-23 ENCOUNTER — Other Ambulatory Visit: Payer: Self-pay | Admitting: Radiation Oncology

## 2016-01-23 ENCOUNTER — Telehealth: Payer: Self-pay | Admitting: *Deleted

## 2016-01-23 ENCOUNTER — Other Ambulatory Visit (HOSPITAL_COMMUNITY): Payer: Self-pay | Admitting: Family Medicine

## 2016-01-23 ENCOUNTER — Inpatient Hospital Stay
Admission: RE | Admit: 2016-01-23 | Discharge: 2016-01-23 | Disposition: A | Discharge status: Home / Self Care | Payer: Self-pay | Source: Ambulatory Visit | Attending: Radiation Oncology | Admitting: Radiation Oncology

## 2016-01-23 DIAGNOSIS — C801 Malignant (primary) neoplasm, unspecified: Secondary | ICD-10-CM

## 2016-01-23 NOTE — Progress Notes (Signed)
Head and Neck Cancer Location of Tumor / Histology:  12/08/15 Right Submental Lymph node- Fine needle aspiration.  Poorly differentiated malignant neoplasm, can not further specify.  01/06/16  FINAL PATHOLOGIC DIAGNOSIS MICROSCOPIC EXAMINATION PERFORMED AND SUPPORTS DIAGNOSIS   A. TONSIL, BIOPSY: No diagnostic abnormality.  B. TONSIL, BIOPSY: No diagnostic abnormality.  C. LYMPH NODES, RIGHT LEVEL IB, EXCISION: Metastatic poorly differentiated carcinoma identified in one lymph node (1/4). See comment.   COMMENT: The immunostaining pattern is consistent with squamous cell carcinoma (positive CK AE1/3, CK5/6 and P63, but negative S100 protein). P16 is also negative.    Patient presented with symptoms of: Jaclyn Schultz is seen in clinic today (12/01/15) for evaluation of a recently noted right neck mass. She has a history of oral cancer treated with cancer in 2013. First noticed the mass under her chin about 3 weeks ago. Firm and mobile. Has not noted any changes or new lesions in her oral cavity.      Nutrition Status Yes No Comments  Weight changes? []  [x]    Swallowing concerns? []  [x]    PEG? []  [x]     Referrals Yes No Comments  Social Work? []  [x]    Dentistry? []  [x]    Swallowing therapy? []  [x]    Nutrition? []  [x]    Med/Onc? []  [x]     Safety Issues Yes No Comments  Prior radiation? []  [x]    Pacemaker/ICD? []  [x]    Possible current pregnancy? []  [x]    Is the patient on methotrexate? []  [x]     Tobacco/Marijuana/Snuff/ETOH use: Never a smoker. She does have a history of smokeless tobacco use, She quit in 2014, after using since she was 71 or 82 years old.   Past/Anticipated interventions by otolaryngology, if any:  . LIP LESION EXCISION 06/20/2012  Procedure: LIP LESION EXCISION; Surgeon: Bolivar Haw, MD; Location[facility] MAIN OR; Service: ENT; Laterality: Left;   . LARYNGOSCOPY N/A 01/06/2016  Procedure: LARYNGOSCOPY DIRECT; Surgeon: Bolivar Haw, MD;  Location: [facility] MAIN OR; Service: ENT; Laterality: N/A;  Microdirect  Laryngoscopy and biopsy of right glossotonsilar sulcus. Marland Kitchen NECK DISSECTION Right 01/06/2016  Procedure: SELECTIVE NECK DISSECTION; Surgeon: Bolivar Haw, MD; Location: [facility] MAIN OR; Service: ENT; Laterality: Right;  Right modified radical neck dissection (level 1B),  Revision surgery.  Past/Anticipated interventions by medical oncology, if any: No     Current Complaints / other details:  12/29/15 PET FINDINGS:   HEAD and NECK:   Asymmetric asymmetric hypermetabolic right glossotonsillar sulcus uptake (image 34 of series 2). Hypermetabolic right 1.4 x 1.6 cm level 1 (image 47 of series 2) lymph node with maximal SUV of 5.4  No abnormal FDG uptake is seen in the face, orbits, sinuses, or thyroid.  CHEST:  There are 2 hypermetabolic (maximal SUV 3.3) left supraclavicular lymph nodes, the largest measuring 1.2 x 0.7 cm (image 75 of series 2). Multivessel coronary artery disease with dense atherosclerosis of portions of the Calcified granuloma right lower lobe Diffuse circumferential low level uptake involving the lower two thirds of the thoracic esophagus suggesting esophagitis  No abnormal FDG uptake is seen in the heart, lungs, pleura, esophagus, hila/mediastinum, axilla, or breasts.  ABDOMEN/PELVIS:  Cholelithiasis Hysterectomy Physiologic bowel uptake within the lower abdomen/anatomic pelvis  No abnormal FDG uptake is seen in the liver, spleen, gallbladder, pancreas, adrenals, peritoneum, extraperitoneum, nodes, or reproductive tract.  MUSCULOSKELETAL:   No abnormal FDG uptake is seen in the soft tissues or bones.  Expected physiologic activity within the kidneys, ureter, bladder, oropharynx, salivary glands, stomach, bowel  and brain.  BP 119/47 mmHg  Pulse 58  Temp(Src) 97.7 F (36.5 C)  Ht 5\' 2"  (1.575 m)  Wt 144 lb 12.8 oz (65.681 kg)  BMI 26.48 kg/m2  SpO2 100%   Wt Readings from Last 3  Encounters:  01/30/16 144 lb 12.8 oz (65.681 kg)  07/08/15 145 lb (65.772 kg)  06/30/15 145 lb (65.772 kg)

## 2016-01-24 NOTE — Telephone Encounter (Signed)
  Oncology Nurse Navigator Documentation  Navigator Location: [facility] (01/23/16 1555) Navigator Encounter Type: Introductory phone call (01/23/16 1555)                       Placed introductory call to new referral patient Jaclyn Schultz.  Introduced myself as the H&N oncology nurse navigator that works with Dr. Isidore Moos to whom she has been referred by Dr. Conley Canal.   She confirmed understanding of referral and appt date/time of 01/30/16 0830 NE/0900 [place].  I briefly explained my role as her navigator, indicated that I would be joining her during his appt next week.  I confirmed understanding of [place] location, explained arrival and Rad[facility]c registration process for appt.  I provided my contact information, encouraged her call with questions/concerns before next week.  She verbalized understanding of information provided, expressed appreciation for my call. I later spoke with her daughter, provided same information.  Gayleen Orem, RN, BSN, [place] [facility] [phone]                         Time Spent with Patient: 15 (01/23/16 1555)

## 2016-01-29 NOTE — Progress Notes (Addendum)
Radiation Oncology         [phone] ________________________________  Initial outpatient Consultation  Name: Jaclyn Schultz MRN: [id number]  Date: 01/30/2016  DOB: 1934/06/05  [id number] D, MD  Fredricka Bonine, *   REFERRING PHYSICIAN: Fredricka Bonine, *  DIAGNOSIS:    ICD-9-CM ICD-10-CM   1. Primary squamous cell carcinoma of head and neck (HCC) 195.0 C76.0 TSH     Ambulatory referral to Oncology     Ambulatory referral to Social Work     Ambulatory referral to Physical Therapy     Amb Referral to Nutrition and Diabetic E     Referral to Neuro Rehab     Ambulatory referral to Dentistry  2. 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 female wo has a history of oral cancer treated with cancer in 2012-2013. She had resection of submental flap on 09/29/10 by Dr.Sullivan in the left gingival area.  I don't have those records right now. Pt reports that she does not believe that nodes were involved but some were removed. No adjuvant therapy. On 05/18/12 Dr. Conley Canal performed left oral lip biopsy which revealed a superficial invasive squamous cell carcinoma. She underwent lip excisional surgery in October 2013 by Dr.Sullivan. Pathology notes on 06/20/12 revealed focal residual squamous cell carcinoma in situ of the left lower lip, extending to the superior circumferential margin and lateral tip.   She first noticed a firm and mobile mass under her chin in late March 2017. On 12/08/15 the patient underwent fine needle aspiration of the right submental lymph node, this was found to be a poorly differentiated malignant neoplasm. PET scan on 12/29/15 confirmed a malignant lymphadenopathy in one right level I and two left supraclavicular lymph nodes. Right node was measured at 1.6 cm and largest  left was 1.2 cm in size. No distant metastatic disease. There was asymmetric hypermetabolic activity in the left tonsillar sulculus.   On 01/06/16 two tonsil biopsies were performed, laterally not specified, no diagnostic abnormalility in these biopsies. 4 lymph nodes were removed at right level Ib. 1 lymph node was positive for metastatic poorly differentiated carcinoma, p16 negative. No comment re: ECE.  On 01/06/16 biopsy she was examined under anesthesia by Dr.Sullivan, there was no mention of abnormality in the mucosal axis from oral cavity through larynx.   Denies weight changes no swallowing concerns, never smoker, smokeless tobacco since age 15 and quit in July 2012. Denies MI, stroke, diabetes.   She reports a history of several skin cancers that were recently removed from her upper face, she is unable to remember the exact location. Lives in [place].   PREVIOUS RADIATION THERAPY: No  PAST MEDICAL HISTORY:  has a past medical history of Vitamin B 12 deficiency; Vertigo; High cholesterol; Hypertension; Cancer ([place]); A-fib ([place]); Osteoporosis; and Dementia.    PAST SURGICAL HISTORY: Past Surgical History  Procedure Laterality Date  . Mandible surgery    . 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 Appendectomy    . Cervical disc surgery    . Cardiac catheterization  10/2009    normal coronary arteries  . Colonoscopy  05/27/2004    [id number] hemorrhoids/sigmoid diverticula  . Colonoscopy N/A 07/08/2015    Procedure: COLONOSCOPY;  Surgeon:  Daneil Dolin, MD;  [facility];  Service: Endoscopy;  Laterality: [id number]  . Biopsy N/A 07/08/2015    Procedure: BIOPSY;  Surgeon: Daneil Dolin, MD;  Location: [facility];  Service: Endoscopy;  Laterality: N/A;  possible random colon biopsies  . Shoulder surgery  remote    FAMILY HISTORY: family history includes Colon cancer in her sister.  SOCIAL HISTORY:  reports that she has never smoked. She quit smokeless tobacco  use about 2 years ago. Her smokeless tobacco use included Chew. She reports that she does not drink alcohol or use illicit drugs.  ALLERGIES: Erythromycin base  MEDICATIONS:  Current Outpatient Prescriptions  Medication Sig Dispense Refill  . donepezil (ARICEPT) 5 MG tablet Take 5 mg by mouth at bedtime.    . hydrochlorothiazide (HYDRODIURIL) 25 MG tablet Take 12.5 mg by mouth daily.    Marland Kitchen losartan (COZAAR) 100 MG tablet Take 1 tablet (100 mg total) by mouth daily. 90 tablet 3  . metoprolol tartrate (LOPRESSOR) 25 MG tablet Take 25 mg by mouth 2 (two) times daily.     . potassium chloride (KLOR-CON) 10 MEQ CR tablet Take 10 mEq by mouth daily.     . simvastatin (ZOCOR) 40 MG tablet Take 40 mg by mouth at bedtime.     Marland Kitchen trimethoprim (TRIMPEX) 100 MG tablet Take 1 tablet by mouth daily.  11  . VENTOLIN HFA 108 (90 BASE) MCG/ACT inhaler Take 2 puffs by mouth 3 (three) times daily as needed.  11  . alendronate (FOSAMAX) 70 MG tablet Take 70 mg by mouth every 7 (seven) days. Reported on 01/30/2016    . polyethylene glycol-electrolytes (NULYTELY/GOLYTELY) 420 G solution Take 4,000 mLs by mouth once. (Patient not taking: Reported on 01/30/2016) 4000 mL 0  . warfarin (COUMADIN) 5 MG tablet Take 1 tablet daily or as directed (Patient taking differently: Take 5 mg by mouth every evening. Take 1 tablet one day and 1/2 tablet the next day, and repeat.) 30 tablet 3   No current facility-administered medications for this encounter.    REVIEW OF SYSTEMS:  Notable for that above.   PHYSICAL EXAM:  height is 5\' 2"  (1.575 m) and weight is 144 lb 12.8 oz (65.681 kg). Her temperature is 97.7 F (36.5 C). Her blood pressure is 119/47 and her pulse is 58. Her oxygen saturation is 100%.   General: Alert and oriented, in no acute distress  HEENT: Head is normocephalic. Extraocular movements are intact. Oral cavity with several missing teeth. S/p flap reconstruction along the left lower gingival area. Along the right  floor of mouth the tissue shows erythematous patches. There is a white patch along the right buccal mucosa that is ~2 mm. Erythema seen in the right oral mucosa commisure.  That being said, there are no obvious  signs of oral or oropharyngeal tumor.  Neck: Neck is notable for surgical glue and a healing scar along the right upper neck with no drainage. No palpable lymph nodes in the pre or post auricular regions or supraclavicular and cervical regions. Heart: Regular in rate and rhythm with no murmurs, rubs, or gallops.  Chest: Clear to auscultation bilaterally, with no rhonchi, wheezes, or rales. Abdomen: Soft, nontender, nondistended, with no rigidity or guarding. Extremities: No cyanosis or edema. Lymphatics: see Neck Exam Skin: No concerning lesions. Musculoskeletal: symmetric strength and muscle tone throughout. Neurologic: Cranial nerves II through XII are grossly intact. No obvious focalities. Speech is fluent. Coordination is intact. Psychiatric:  Judgment and insight are intact. Affect is appropriate.   ECOG = 1  0 - Asymptomatic (Fully active, able to carry on all predisease activities without restriction)  1 - Symptomatic but completely ambulatory (Restricted in physically strenuous activity but ambulatory and able to carry out work of a light or sedentary nature. For example, light housework, office work)  2 - Symptomatic, <50% in bed during the day (Ambulatory and capable of all self care but unable to carry out any work activities. Up and about more than 50% of waking hours)  3 - Symptomatic, >50% in bed, but not bedbound (Capable of only limited self-care, confined to bed or chair 50% or more of waking hours)  4 - Bedbound (Completely disabled. Cannot carry on any self-care. Totally confined to bed or chair)  5 - Death   Eustace Pen MM, Creech RH, Tormey DC, et al. [phone]). "Toxicity and response criteria of the [facility]". [place] Oncol. 5 (6):  649-55   LABORATORY DATA:  Lab Results  Component Value Date   WBC 7.7 09/26/2014   HGB 15.0 09/26/2014   HCT 44.6 09/26/2014   MCV 93.3 09/26/2014   PLT 198 09/26/2014   CMP     Component Value Date/Time   NA 139 09/26/2014 1420   K 3.8 09/26/2014 1420   CL 104 09/26/2014 1420   CO2 25 09/26/2014 1420   GLUCOSE 95 09/26/2014 1420   BUN 24* 09/26/2014 1420   CREATININE 1.16* 09/26/2014 1420   CREATININE 0.95 06/03/2014 1453   CALCIUM 9.6 09/26/2014 1420   PROT 7.5 09/26/2014 1420   ALBUMIN 4.1 09/26/2014 1420   AST 24 09/26/2014 1420   ALT 17 09/26/2014 1420   ALKPHOS 70 09/26/2014 1420   BILITOT 0.9 09/26/2014 1420   GFRNONAA 44* 09/26/2014 1420   GFRNONAA 58* 06/03/2014 1453   GFRAA 51* 09/26/2014 1420   GFRAA 67 06/03/2014 1453      Lab Results  Component Value Date   BILITOT 0.9 09/26/2014      RADIOGRAPHY: as above    IMPRESSION/PLAN:  This is a delightful patient with head and neck cancer. I do recommend radiotherapy for this patient.  I would like to talk with Dr Conley Canal about whether further surgery is warranted, first, given the positive nodes in the lower left neck. I left a message for him to call me back and hopefully we will connect soon.  We discussed the potential risks, benefits, and side effects of radiotherapy. We talked in detail about acute and late effects. We discussed that some of the most bothersome acute effects may be mucositis, dysgeusia, salivary changes, skin irritation, hair loss, dehydration, weight loss and fatigue. We talked about late effects which include but are not necessarily limited to dysphagia, hypothyroidism, nerve injury, spinal cord injury, xerostomia,  and neck edema. No guarantees of treatment were given. A consent form was signed and placed in the patient's medical record. The patient is enthusiastic about proceeding with treatment. I look forward to participating in the patient's care.    Simulation (treatment  planning) will take place after clearance from dentistry.   We also discussed that the treatment of head and neck cancer is a multidisciplinary process to maximize treatment outcomes and quality of life. For this reasons the following referrals have been or will be made:   Medical oncology to discuss chemotherapy - This will be scheduled ASAP   Dentistry for dental evaluation- this will be scheduled ASAP and before  her vacation on 02/14/16   Nutritionist for nutrition support during and after treatment.   Speech language pathology for swallowing and/or speech therapy.   Social work for social support.    Physical therapy due to risk of lymphedema in neck and deconditioning.   Baseline labs including TSH.   > She has plans for a family vacation between June 17th-25th. I will try to get her scheduled for dental consultation and simulation before then.  __________________________________________    Eppie Gibson, MD  This document serves as a record of services personally performed by Eppie Gibson, MD. It was created on her behalf by Derek Mound, a trained medical scribe. The creation of this record is based on the scribe's personal observations and the provider's statements to them. This document has been checked and approved by the attending provider.

## 2016-01-30 ENCOUNTER — Encounter: Payer: Self-pay | Admitting: *Deleted

## 2016-01-30 ENCOUNTER — Ambulatory Visit
Admission: RE | Admit: 2016-01-30 | Discharge: 2016-01-30 | Disposition: A | Discharge status: Home / Self Care | Payer: Medicare HMO | Source: Ambulatory Visit | Attending: Radiation Oncology | Admitting: Radiation Oncology

## 2016-01-30 ENCOUNTER — Encounter: Payer: Self-pay | Admitting: Radiation Oncology

## 2016-01-30 ENCOUNTER — Telehealth: Payer: Self-pay | Admitting: *Deleted

## 2016-01-30 VITALS — BP 119/47 | HR 58 | Temp 97.7°F | Ht 62.0 in | Wt 144.8 lb

## 2016-01-30 DIAGNOSIS — C760 Malignant neoplasm of head, face and neck: Secondary | ICD-10-CM | POA: Insufficient documentation

## 2016-01-30 DIAGNOSIS — R5381 Other malaise: Secondary | ICD-10-CM

## 2016-01-30 NOTE — Telephone Encounter (Signed)
CALLED PATIENT TO INFORM OF APPT. FOR LAB ON 02-03-16 @  12 PM, SPOKE WITH PATIENT AND SHE AGREED TO THAT APPT.

## 2016-01-30 NOTE — Addendum Note (Signed)
Encounter addended by: Ernst Spell, RN on: 01/30/2016  1:04 PM<BR>     Documentation filed: Charges VN

## 2016-02-02 ENCOUNTER — Telehealth: Payer: Self-pay | Admitting: *Deleted

## 2016-02-02 NOTE — Telephone Encounter (Signed)
  Oncology Nurse Navigator Documentation  Navigator Location: [facility] (02/02/16 1548) Navigator Encounter Type: Telephone (02/02/16 1548) Telephone: Jerilee Hoh Confirmation/Clarification (02/02/16 1548)                     Returned patient daughter's VMM, confirmed appts for tomorrow (10:15 Alvy Bimler, 12:00 lab, 12:45 [place]), explained check-in/registration process.  She voiced understanding.  Dtr provided new mobile phone number, I entered change in Demographics.  Gayleen Orem, RN, BSN, [place] [facility] [place] [phone]                       Time Spent with Patient: 15 (02/02/16 1548)

## 2016-02-03 ENCOUNTER — Other Ambulatory Visit (HOSPITAL_COMMUNITY): Payer: Commercial Managed Care - HMO | Admitting: Dentistry

## 2016-02-03 ENCOUNTER — Ambulatory Visit (HOSPITAL_COMMUNITY): Payer: Self-pay | Admitting: Dentistry

## 2016-02-03 ENCOUNTER — Encounter (HOSPITAL_COMMUNITY): Payer: Self-pay | Admitting: Dentistry

## 2016-02-03 ENCOUNTER — Encounter: Payer: Self-pay | Admitting: Hematology and Oncology

## 2016-02-03 ENCOUNTER — Ambulatory Visit (HOSPITAL_BASED_OUTPATIENT_CLINIC_OR_DEPARTMENT_OTHER): Payer: Medicare HMO | Admitting: Hematology and Oncology

## 2016-02-03 ENCOUNTER — Encounter: Payer: Self-pay | Admitting: *Deleted

## 2016-02-03 ENCOUNTER — Ambulatory Visit: Admission: RE | Admit: 2016-02-03 | Payer: Commercial Managed Care - HMO | Source: Ambulatory Visit

## 2016-02-03 VITALS — BP 96/42 | HR 58 | Temp 97.8°F

## 2016-02-03 VITALS — BP 119/50 | HR 55 | Temp 97.7°F | Resp 18 | Ht 62.0 in | Wt 145.9 lb

## 2016-02-03 DIAGNOSIS — C760 Malignant neoplasm of head, face and neck: Secondary | ICD-10-CM

## 2016-02-03 DIAGNOSIS — Z800 Family history of malignant neoplasm of digestive organs: Secondary | ICD-10-CM

## 2016-02-03 DIAGNOSIS — K0401 Reversible pulpitis: Secondary | ICD-10-CM

## 2016-02-03 DIAGNOSIS — I4891 Unspecified atrial fibrillation: Secondary | ICD-10-CM | POA: Diagnosis not present

## 2016-02-03 DIAGNOSIS — K0889 Other specified disorders of teeth and supporting structures: Secondary | ICD-10-CM

## 2016-02-03 DIAGNOSIS — Z9189 Other specified personal risk factors, not elsewhere classified: Secondary | ICD-10-CM

## 2016-02-03 DIAGNOSIS — C770 Secondary and unspecified malignant neoplasm of lymph nodes of head, face and neck: Secondary | ICD-10-CM | POA: Diagnosis not present

## 2016-02-03 DIAGNOSIS — K0820 Unspecified atrophy of edentulous alveolar ridge: Secondary | ICD-10-CM

## 2016-02-03 DIAGNOSIS — K029 Dental caries, unspecified: Secondary | ICD-10-CM

## 2016-02-03 DIAGNOSIS — K08409 Partial loss of teeth, unspecified cause, unspecified class: Secondary | ICD-10-CM

## 2016-02-03 DIAGNOSIS — G309 Alzheimer's disease, unspecified: Secondary | ICD-10-CM | POA: Diagnosis not present

## 2016-02-03 DIAGNOSIS — M8710 Osteonecrosis due to drugs, unspecified bone: Secondary | ICD-10-CM

## 2016-02-03 DIAGNOSIS — Z01818 Encounter for other preprocedural examination: Secondary | ICD-10-CM | POA: Diagnosis not present

## 2016-02-03 DIAGNOSIS — IMO0002 Reserved for concepts with insufficient information to code with codable children: Secondary | ICD-10-CM

## 2016-02-03 DIAGNOSIS — K0530 Chronic periodontitis, unspecified: Secondary | ICD-10-CM

## 2016-02-03 DIAGNOSIS — Z7901 Long term (current) use of anticoagulants: Secondary | ICD-10-CM

## 2016-02-03 DIAGNOSIS — M264 Malocclusion, unspecified: Secondary | ICD-10-CM

## 2016-02-03 DIAGNOSIS — K036 Deposits [accretions] on teeth: Secondary | ICD-10-CM

## 2016-02-03 DIAGNOSIS — K229 Disease of esophagus, unspecified: Secondary | ICD-10-CM | POA: Insufficient documentation

## 2016-02-03 DIAGNOSIS — K030 Excessive attrition of teeth: Secondary | ICD-10-CM

## 2016-02-03 DIAGNOSIS — F0280 Dementia in other diseases classified elsewhere without behavioral disturbance: Secondary | ICD-10-CM | POA: Insufficient documentation

## 2016-02-03 NOTE — Progress Notes (Signed)
Oncology Nurse Navigator Documentation  Navigator Location: [facility] (02/03/16 1015) Navigator Encounter Type: Initial MedOnc (02/03/16 1015)               Barriers/Navigation Needs: No barriers at this time (02/03/16 1015)     Met with Ms. Tankard during initial consult with Dr. Alvy Bimler.  She was accompanied by her granddaughter Judeen Hammans. She/granddaughter provided home information:  Her son lives with her during the week, she lives alone over the weekend.  Her dtrs manage her medications using a blue pill box for morning medications, a white box for evening medications.  They call her every AM and  PM to insure she has taken medications.  Her 3 daughters also live in [place].  She is able to conduct ADLs without assistance.  Drives short distances.  Onset of mild dementia 3 yrs ago. They understand that 5/1 PET suggests additional disease, that further discussion is to be conducted at tomorrow morning's H&N Conference and they will be updated. They understand I can be contacted with questions/needs/concerns.  Gayleen Orem, RN, BSN, [facility] [place] [phone]                            Time Spent with Patient: 75 (02/03/16 1015)

## 2016-02-03 NOTE — Patient Instructions (Signed)
RADIATION THERAPY AND DECISIONS REGARDING YOUR TEETH  Xerostomia (dry mouth) Your salivary glands may be in the filed of radiation.  Radiation may include all or part of your saliva glands.  This will cause your saliva to dry up and you will have a dry mouth.  The dry mouth will be for the rest of your life unless your radiation oncologist tells you otherwise.  Your saliva has many functions:  Saliva wets your tongue for speaking.  It coats your teeth and the inside of your mouth for easier movement.  It helps with chewing and swallowing food.  It helps clean away harmful acid and toxic products made by the germs in your mouth, therefore it helps prevent cavities.  It kills some germs in your mouth and helps to prevent gum disease.  It helps to carry flavor to your taste buds.  Once you have lost your saliva you will be at higher risk for tooth decay and gum disease.  What can be done to help improve your mouth when there's not enough saliva:  1.  Your dentist may give a prescription for Salagen.  It will not bring back all of your saliva but may bring back some of it.  Also your saliva may be thick and ropy or white and foamy. It will not feel like it use to feel.  2.  You will need to swish with water every time your mouth feels dry.  YOU CANNOT suck on any cough drops, mints, lemon drops, candy, vitamin C or any other products.  You cannot use anything other than water to make your mouth feel less dry.  If you want to drink anything else you have to drink it all at once and brush afterwards.  Be sure to discuss the details of your diet habits with your dentist or hygienist.  Radiation caries: This is decay that happens very quickly once your mouth is very dry due to radiation therapy.  Normally cavities take six months to two years to become a problem.  When you have dry mouth cavities may take as little as eight weeks to cause you a problem.  This is why dental check ups every two  months are necessary as long as you have a dry mouth. Radiation caries typically, but not always, start at your gum line where it is hard to see the cavity.  It is therefore also hard to fill these cavities adequately.  This high rate of cavities happens because your mouth no longer has saliva and therefore the acid made by the germs starts the decay process.  Whenever you eat anything the germs in your mouth change the food into acid.  The acid then burns a small hole in your tooth.  This small hole is the beginning of a cavity.  If this is not treated then it will grow bigger and become a cavity.  The way to avoid this hole getting bigger is to use fluoride every evening as prescribed by your dentist.  You have to make sure that your teeth are very clean before you use the fluoride.  This fluoride in turn will strengthen your teeth and prepare them for another day of fighting acid.  If you develop radiation caries many times the damage is so large that you will have to have all your teeth removed.  This could be a big problem if some of these teeth are in the field of radiation.  Further details of why this could be   a big problem will follow.  (See Osteoradionecrosis).  Loss of taste (dysgeusia) This happens to varying degrees once you've had radiation therapy to your jaw region.  Many times taste is not completely lost but becomes limited.  The loss of taste is mostly due to radiation affecting your taste buds.  However if you have no saliva in your mouth to carry the flavor to your taste buds it would be difficult for your taste buds to taste anything.  That is why using water or a prescription for Salagen prior to meals and during meals may help with some of the taste.  Keep in mind that taste generally returns very slowly over the course of several months or several years after radiation therapy.  Don't give up hope.  Trismus According to your Radiation Oncologist your TMJ or jaw joints are going to be  partially or fully in the field of radiation.  This means that over time the muscles that help you open and close your mouth may get stiff.  This will potentially result in your not being able to open your mouth wide enough or as wide as you can open it now.  Le me give you an example of how slowly this happens and how unaware people are of it.  A gentlemen that had radiation therapy two years ago came back to me complaining that bananas are just too large for him to be able to fit them in between his teeth.  He was not able to open wide enough to bite into a banana.  This happens slowly and over a period of time.  What do we do to try and prevent this?  Your dentist will probably give you a stack of sticks called a trismus exercise device .  This stack will help your remind your muscles and your jaw joint to open up to the same distance every day.  Use these sticks every morning when you wake up according to the instructions given by the dentist.   You must use these sticks for at least one to two years after radiation therapy.  The reason for that is because it happens so slowly and keeps going on for about two years after radiation therapy.  Your hospital dentist will help you monitor your mouth opening and make sure that it's not getting smaller.  Osteoradionecrosis (ORN) This is a condition where your jaw bone after having had radiation therapy becomes very dry.  It has very little blood supply to keep it alive.  If you develop a cavity that turns into an abscess or an infection then the jaw bone does not have enough blood supply to help fight the infection.  At this point it is very likely that the infection could cause the death of your jaw bone.  When you have dead bone it has to be removed.  Therefore you might end up having to have surgery to remove part of your jaw bone, the part of the jaw bone that has been affected.   Healing is also a problem if you are to have surgery in the areas where the bone  has had radiation therapy.  The same reasons apply.  If you have surgery you need more blood supply which is not available.  When blood supply and oxygen are not available again, there is a chance for the bone to die.  Occasionally ORN happens on its own with no obvious reason.  This is quite rare.  We believe that   patients who continue to smoke and/or drink alcohol have a higher chance of having this bone problem.  Therefore once your jaw bone has had radiation therapy if there are any teeth in that area, you should never have them pulled.  You should also never have any surgery on your teeth or gums in that area unless the oral surgeon or Periodontist is aware of your history of radiation. There is some expensive management techniques that might be used to limit your risks.  The risks for ORN either from infection or spontaneous ( or on it's own) are life long.    TRISMUS  Trismus is a condition where the jaw does not allow the mouth to open as wide as it usually does.  This can happen almost suddenly, or in other cases the process is so slow, it is hard to notice it-until it is too far along.  When the jaw joints and/or muscles have been exposed to radiation treatments, the onset of Trismus is very slow.  This is because the muscles are losing their stretching ability over a long period of time, as long as 2 YEARS after the end of radiation.  It is therefore important to exercise these muscles and joints.  TRISMUS EXERCISES   Stack of tongue depressors measuring the same or a little less than the last documented MIO (Maximum Interincisal Opening).  Secure them with a rubber band on both ends.  Place the stack in the patient's mouth, supporting the other end.  Allow 30 seconds for muscle stretching.  Rest for a few seconds.  Repeat 3-5 times  For all radiation patients, this exercise is recommended in the mornings and evenings unless otherwise instructed.  The exercise should be done for  a period of 2 YEARS after the end of radiation.  MIO should be checked routinely on recall dental visits by the general dentist or the hospital dentist.  The patient is advised to report any changes, soreness, or difficulties encountered when doing the exercises.   .rk 

## 2016-02-03 NOTE — Progress Notes (Signed)
DENTAL CONSULTATION  Date of Consultation:  02/03/2016 Patient Name:   Jaclyn Schultz Date of Birth:   1934-02-06 Medical Record Number: [id number]  VITALS: BP 96/42 mmHg  Pulse 58  Temp(Src) 97.8 F (36.6 C) (Oral)  CHIEF COMPLAINT: Patient is referred by Dr. Isidore Moos for a dental consultation.  HPI: Jaclyn Schultz is a 82 year old female with history of oral cancer that was treated with surgery in 2012 and 2013. She had a surgical resection with submental flap on 09/29/2010 by Dr. Conley Canal at [facility]. On 05/18/2012 Dr. Conley Canal performed a left lip biopsy that revealed a superficial invasive squamous cell carcinoma. She then underwent lip excisional surgery in October 2013 by Dr. Conley Canal. Patient then noted a firm mass under her chin in March 2017. She underwent fine-needle aspirate which found poorly differentiated malignant neoplasm. PET scan revealed malignant lymphadenopathy and one right neck node and and 2 left supraclavicular lymph nodes.  There also was some hypermetabolic activity in the left tonsillar sulcus. Patient then underwent 2 tonsil biopsies along with lymph node removal involving right level IB. One lymph node was positive for metastatic poorly differentiated carcinoma that was P 16 negative. Patient was evaluated by Dr. Isidore Moos with anticipated radiation therapy. Patient also with possible chemotherapy with Dr. Alvy Bimler. Patient is now seen as part of a prechemoradiation therapy dental protocol examination.  The patient is currently complaining of upper right molar tooth pain. Patient describes a dull, achy intermittent pain that lasts for hours at a time. Patient indicates that is currently hurting at a 5 out of 10 in intensity today. Patient case that the pain is been present for "a good while" and that it has been bothering her for "months".  Patient last saw a dentist in 2012/2013 to have some teeth pulled prior to surgery. This was performed by Dr. Hadley Pen, an oral  surgeon, with no complications. Patient subsequently indicated that she also saw a general dentist in 2013/2014 in [place], [place]. The dentist referred her to Dr. Hadley Pen again for a dental extraction at that time. There were no complications again. Patient denies having any partial dentures. Patient indicates that she does have some dental phobia and is "afraid of the dentist sometimes." When asked if she had any specific reason for having dental phobia, she indicated "I'm scared of 'em".    BLEM LIST: Patient Active Problem List   Diagnosis Date Noted  . Primary squamous cell carcinoma of head and neck ([place]) 01/30/2016    Priority: Medium  . Metastasis to supraclavicular lymph node ([place]) 02/03/2016  . Esophageal abnormality 02/03/2016  . Alzheimer's dementia without behavioral disturbance 02/03/2016  . Diarrhea 06/30/2015  . FH: colon cancer 06/30/2015  . Foot fracture, left 10/09/2013  . Encounter for therapeutic drug monitoring 09/26/2013  . Metatarsal fracture 08/14/2013  . Bronchitis, acute 07/12/2013  . SOB (shortness of breath) 07/12/2013  . 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 Chronic periodontitis, unspecified 03/22/2013  . Allergic rhinitis due to pollen 03/22/2013  . 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 Malignant neoplasm of head, face, and neck ([place]) 03/22/2013  . Other B-complex deficiencies 03/22/2013  . Mixed hyperlipidemia 03/22/2013  . Essential hypertension, benign 03/22/2013  . Reflux esophagitis 03/22/2013  . Senile osteoporosis 03/22/2013  . Atrial fibrillation, chronic ([place]) 11/05/2012  . Long term (current) use of anticoagulants 11/05/2012    PMH: Past Medical History  Diagnosis Date  . Vitamin  B 12 deficiency   . Vertigo   . High cholesterol   . Hypertension   . Cancer ([place])     mouth/dx 08-2010/surg only  . A-fib ([place])   . Osteoporosis   . Dementia      Early    PSH: Past Surgical History  Procedure Laterality Date  . Mandible surgery    . 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 Appendectomy    . Cervical disc surgery    . Cardiac catheterization  10/2009    normal coronary arteries  . Colonoscopy  05/27/2004    [id number] hemorrhoids/sigmoid diverticula  . Colonoscopy N/A 07/08/2015    Procedure: COLONOSCOPY;  Surgeon: Daneil Dolin, MD;  [facility];  Service: Endoscopy;  Laterality: [id number]  . Biopsy N/A 07/08/2015    Procedure: BIOPSY;  Surgeon: Daneil Dolin, MD;  [facility];  Service: Endoscopy;  Laterality: N/A;  possible random colon biopsies  . Shoulder surgery  remote    ALLERGIES: Allergies  Allergen Reactions  . Erythromycin Base Rash    MEDICATIONS: Current Outpatient Prescriptions  Medication Sig Dispense Refill  . Calcium Citrate-Vitamin D (CALCIUM + D PO) Take 600 mg by mouth daily.    . Cetirizine HCl (ZYRTEC ALLERGY) 10 MG CAPS Take by mouth daily.    Marland Kitchen donepezil (ARICEPT) 5 MG tablet Take 5 mg by mouth at bedtime.    . hydrochlorothiazide (HYDRODIURIL) 25 MG tablet Take 12.5 mg by mouth daily.    Marland Kitchen losartan (COZAAR) 100 MG tablet Take 1 tablet (100 mg total) by mouth daily. 90 tablet 3  . metoprolol tartrate (LOPRESSOR) 25 MG tablet Take 25 mg by mouth 2 (two) times daily.     . potassium chloride (KLOR-CON) 10 MEQ CR tablet Take 10 mEq by mouth daily.     . simvastatin (ZOCOR) 40 MG tablet Take 40 mg by mouth at bedtime.     Marland Kitchen trimethoprim (TRIMPEX) 100 MG tablet Take 1 tablet by mouth daily.  11  . vitamin B-12 (CYANOCOBALAMIN) 1000 MCG tablet Take 1,000 mcg by mouth daily.    Marland Kitchen warfarin (COUMADIN) 5 MG tablet Take 1 tablet daily or as directed (Patient taking differently: Take 5 mg by mouth every evening. Take 1 tablet one day and 1/2 tablet the next day, and repeat.) 30 tablet 3   No current facility-administered medications for this visit.    LABS: Lab  Results  Component Value Date   WBC 7.7 09/26/2014   HGB 15.0 09/26/2014   HCT 44.6 09/26/2014   MCV 93.3 09/26/2014   PLT 198 09/26/2014      Component Value Date/Time   NA 139 09/26/2014 1420   K 3.8 09/26/2014 1420   CL 104 09/26/2014 1420   CO2 25 09/26/2014 1420   GLUCOSE 95 09/26/2014 1420   BUN 24* 09/26/2014 1420   CREATININE 1.16* 09/26/2014 1420   CREATININE 0.95 06/03/2014 1453   CALCIUM 9.6 09/26/2014 1420   GFRNONAA 44* 09/26/2014 1420   GFRNONAA 58* 06/03/2014 1453   GFRAA 51* 09/26/2014 1420   GFRAA 67 06/03/2014 1453   Lab Results  Component Value Date   INR 2.1 01/21/2016   INR 3.1 12/15/2015   INR 2.9 12/10/2015   No results found for: PTT  SOCIAL HISTORY: Social History   Social History  . Marital Status: Widowed    Spouse Name: N/A  . Number of Children: N/A  .  Years of Education: N/A   Occupational History  . Not on file.   Social History Main Topics  . Smoking status: Never Smoker   . Smokeless tobacco: Former Systems developer    Types: [place] date: 02/27/2013     Comment: Never smoked  . Alcohol Use: No  . Drug Use: No  . Sexual Activity: No   Other Topics Concern  . Not on file   Social History Narrative    FAMILY HISTORY: Family History  Problem Relation Age of Onset  . Colon cancer Sister     age 66    REVIEW OF SYSTEMS: Reviewed with patient as per History of Present Illness. Psych: Patient does have a history of dental phobia.   DENTAL HISTORY: CHIEF COMPLAINT: Patient is referred by Dr. Isidore Moos for a dental consultation.  HPI: CHELCIE NIEDZIELSKI is a 82 year old female with history of oral cancer that was treated with surgery in 2012 and 2013. She had a surgical resection with submental flap on 09/29/2010 by Dr. Conley Canal at [facility]. On 05/18/2012 Dr. Conley Canal performed a left lip biopsy that revealed a superficial invasive squamous cell carcinoma. She then underwent lip excisional surgery in October 2013 by Dr.  Conley Canal. Patient then noted a firm mass under her chin in March 2017. She underwent fine-needle aspirate which found poorly differentiated malignant neoplasm. PET scan revealed malignant lymphadenopathy and one right neck node and and 2 left supraclavicular lymph nodes.  There also was some hypermetabolic activity in the left tonsillar sulcus. Patient then underwent 2 tonsil biopsies along with lymph node removal involving right level IB. One lymph node was positive for metastatic poorly differentiated carcinoma that was P 16 negative. Patient was evaluated by Dr. Isidore Moos with anticipated radiation therapy. Patient also with possible chemotherapy with Dr. Alvy Bimler. Patient is now seen as part of a prechemoradiation therapy dental protocol examination.  The patient is currently complaining of upper right molar tooth pain. Patient describes a dull, achy intermittent pain that lasts for hours at a time. Patient indicates that it is currently hurting at a 5 out of 10 in intensity today. Patient case that the pain is been present for "a good while" and that it has been bothering her for "months".  Patient initially indicated that she last saw a dentist in 2012/2013 to have some teeth pulled prior to surgery. This was performed by Dr. Hadley Pen, an oral surgeon, with no complications. Patient subsequently indicated that she also saw a general dentist in 2013/2014 in [place], [place]. The dentist referred her to Dr. Hadley Pen again for a dental extraction at that time. There were no complications again. Patient denies having any partial dentures. Patient indicates that she does have some dental phobia and is "afraid of the dentist sometimes." When asked if she had any specific reason for having dental phobia, she indicated "I'm scared of 'em".  DENTAL EXAMINATION: GENERAL:Patient is a well-developed, well-nourished female in no acute distress.  HEAD AND NECK: Patient has a scar involving the right neck  consistent with neck dissection. Patient also has some left supraclavicular lymphadenopathy nodes per CT scan.  INTRAORAL EXAM: Patient has normal saliva. Patient's left mandibular area numbers 17 through 24 is consistent with previous surgical resection with submental flap reconstruction performed in 2012. There is no evidence of oral abscess formation.  DENTITION:Patient is missing tooth numbers 1, 2, 4, 5, 7, 13, 16, 17 -24 , 26, 29-32. There is evidence of incisal and occlusal  attrition.  PERIODONTAL:Patient has chronic periodontitis with plaque and calculus accumulations, gingival recession, and tooth mobility as per dental charting form.  DENTAL CARIES/SUBOPTIMAL RESTORATIONS:Dental caries are noted per dental charting form.  ENDODONTIC:Patient is complaining of acute pulpitis symptoms involving tooth #3. No obvious periapical radiolucency is noted.  Chicken has a crown on tooth #11. There are recurrent caries on the distal.  PROSTHODONTIC:Patient has no partial dentures.  OCCLUSION:Patient has a poor occlusal scheme secondary to multiple missing teeth and lack of replacement of missing teeth with clinically acceptable dental prostheses.  RADIOGRAPHIC INTERPRETATION: An orthopantogram and 10 periapical radiographs were obtained on 02/03/2016. There are multiple missing teeth. There is supra-eruption and drifting of the unopposed teeth into the edentulous areas. There is moderate bone loss noted. There is atrophy of the edentulous alveolar ridges. Dental caries are noted. Previous root canal therapy is associated with tooth #11. Multiple dental restorations are noted. Multiple surgical clips from previous surgery are noted. The bilateral maxillary sinuses are noted and appear to be well aerated. There is evidence of incisal and occlusal attrition.  ASSESSMENTS: 1. Squamous cell carcinoma of the head and neck area  2. Pre-chemotherapy radiation therapy dental protocol  examination 3. Acute pulpitis  4. Dental caries 5. Incisal and occlusal attrition 6. Chronic periodontitis with bone loss 7. Accretions 8. Gingival recession 9. Tooth mobility 10. Multiple missing teeth 11. Suboptimal dental restorations 12. Supra-eruption and drifting of the unopposed teeth into the edentulous areas 13. Atrophy of the edentulous alveolar ridges 14. Status post surgical resection with submental flap involving the left mandible area numbers 17-24 15. Poor occlusal scheme and malocclusion 16. History of oral bisphosphonate therapy with the risk for osteonecrosis of the jaw with invasive dental procedures 17. Risk for bleeding with invasive dental procedures due to current warfarin therapy   PLAN/RECOMMENDATIONS: 1. I discussed the risks, benefits, and complications of various treatment options with the patient in relationship to her medical and dental conditions, anticipated radiation therapy and possible chemotherapy, and the risks of chemoradiation therapy to include xerostomia, radiation caries, trismus, mucositis, taste changes, gum and jawbone changes, and risk for infection and osteoradionecrosis.  We discussed various treatment options to include no treatment, total and subtotal extractions with alveoloplasty, pre-prosthetic surgery as indicated, periodontal therapy, dental restorations, root canal therapy, crown and bridge therapy, implant therapy, and replacement of missing teeth as indicated. The patient currently wishes to think about her options but is considering extraction of remaining teeth with alveoloplasty in the operating room with general anesthesia.  Patient will need to be off of her warfarin therapy for 2-3 days prior to surgery. Patient would also like this dental surgery to be scheduled after her dates of vacation from 02/14/2016 through 02/21/2016. If the patient does proceed with extraction of remaining teeth with alveoloplasty in the operating room with  general anesthesia, the patient is aware that a lower complete denture cannot be fabricated due to the previous surgical resection and submental flap reconstruction. Patient could be referred to prosthodontist for discussion of treatment options if so desired.  2. Discussion of findings with medical team and coordination of future medical and dental care as needed.  I spent in excess of  120 minutes during the conduct of this consultation and >50% of this time involved direct face-to-face encounter for counseling and/or coordination of the patient's care.    Lenn Cal, DDS

## 2016-02-04 ENCOUNTER — Encounter: Payer: Self-pay | Admitting: Internal Medicine

## 2016-02-04 ENCOUNTER — Telehealth: Payer: Self-pay | Admitting: *Deleted

## 2016-02-04 ENCOUNTER — Other Ambulatory Visit: Payer: Self-pay | Admitting: Hematology and Oncology

## 2016-02-04 ENCOUNTER — Encounter: Payer: Self-pay | Admitting: Hematology and Oncology

## 2016-02-04 NOTE — Progress Notes (Signed)
[facility] NOTE  Patient Care Team: Lemmie Evens, MD as PCP - General (Family Medicine) Arnoldo Lenis, MD as Consulting Physician (Cardiology) Daneil Dolin, MD as Consulting Physician (Gastroenterology) Fredricka Bonine, MD as Referring Physician (Otolaryngology) Eppie Gibson, MD as Attending Physician (Radiation Oncology) Leota Sauers, RN as Oncology Nurse Navigator  CHIEF COMPLAINTS/PURPOSE OF CONSULTATION:  Recurrent squamous cell carcinoma of the head and neck region  HISTORY OF PRESENTING ILLNESS:  Jaclyn Schultz 82 y.o. female is here because of newly diagnosed squamous cell carcinoma of the head and neck region. The patient has Alzheimer's demented and was not able to provide history. Her granddaughter is present today. The patient has recurrent squamous cell carcinoma of the head and neck region. She also has skin cancer in the past. Her granddaughter thought that she may have her initial diagnosis in 2008 and then subsequent recurrence in 2012. I review her records and summarized as follows:   Malignant neoplasm of head, face, and neck ([place])   09/29/2010 Pathology Results Invasive well differentiated squamous cell carcinoma. Greatest dimension of tumor is 0.5 cm (grossly), invading to depth of 0.2 cm. Medial resection margin positive for carcinoma in softtissue of this specimen  see comment. No invasion of bone   09/29/2010 Surgery She had extensive surgery at [facility] with mandibulectomy and flow of mouth surgery   07/08/2015 Procedure She had normal colonoscopy   12/01/2015 Imaging She had [place] of neck at [facility] which showed 2 palpable areas of right submandibular region   12/08/2015 Procedure She had FNA of right neck lymph node at [facility] which showed poorly-differentiated malignant neoplasm, can not furtherspecify.   12/29/2015 PET scan She had PET scan at [facility] which showed lymphadenopathy in the right neck, 2 lymph node  abnormalities at the left supraclavicular region and abnormal inflammation at the lowed 2/3 of esophagus   she denies any hearing deficit, difficulties with chewing food, swallowing difficulties, painful swallowing, changes in the quality of her voice or abnormal weight loss. She reported recurrent reflux symptoms She has occasional hematochezia which self limiting The patient has chronic atrial fibrillation and is on long-term anticoagulation therapy She denies hematuria or hemoptysis The patient lives alone but with good family support. There were no reported recent falls. She was referred here for possible chemotherapy in view of recurrent squamous cell cancer.   MEDICAL HISTORY:  Past Medical History  Diagnosis Date  . Vitamin B 12 deficiency   . Vertigo   . High cholesterol   . Hypertension   . Cancer ([place])     mouth/dx 08-2010/surg only  . A-fib ([place])   . Osteoporosis   . Dementia     Early    SURGICAL HISTORY: Past Surgical History  Procedure Laterality Date  . Mandible surgery    . 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 Appendectomy    . Cervical disc surgery    . Cardiac catheterization  10/2009    normal coronary arteries  . Colonoscopy  05/27/2004    [id number] hemorrhoids/sigmoid diverticula  . Colonoscopy N/A 07/08/2015    Procedure: COLONOSCOPY;  Surgeon: Daneil Dolin, MD;  [facility];  Service: Endoscopy;  Laterality: [id number]  . Biopsy N/A 07/08/2015    Procedure: BIOPSY;  Surgeon: Daneil Dolin, MD;  [facility];  Service: Endoscopy;  Laterality: N/A;  possible random colon biopsies  . Shoulder surgery  remote    SOCIAL HISTORY: Social History   Social History  . Marital Status: Widowed    Spouse Name: N/A  . Number of Children: 5  . Years of Education: N/A   Occupational History  . Not on file.   Social History Main Topics  . Smoking status: Never Smoker   . Smokeless tobacco: Former Systems developer    Types: [place] date: 02/27/2013     Comment: Never smoked  . Alcohol Use: No  . Drug Use: No  . Sexual Activity: No   Other Topics Concern  . Not on file   Social History Narrative    FAMILY HISTORY: Family History  Problem Relation Age of Onset  . Colon cancer Sister     age 61    ALLERGIES:  is allergic to erythromycin base.  MEDICATIONS:  Current Outpatient Prescriptions  Medication Sig Dispense Refill  . Calcium Citrate-Vitamin D (CALCIUM + D PO) Take 600 mg by mouth daily.    . Cetirizine HCl (ZYRTEC ALLERGY) 10 MG CAPS Take by mouth daily.    Marland Kitchen donepezil (ARICEPT) 5 MG tablet Take 5 mg by mouth at bedtime.    . hydrochlorothiazide (HYDRODIURIL) 25 MG tablet Take 12.5 mg by mouth daily.    Marland Kitchen losartan (COZAAR) 100 MG tablet Take 1 tablet (100 mg total) by mouth daily. 90 tablet 3  . metoprolol tartrate (LOPRESSOR) 25 MG tablet Take 25 mg by mouth 2 (two) times daily.     . potassium chloride (KLOR-CON) 10 MEQ CR tablet Take 10 mEq by mouth daily.     . simvastatin (ZOCOR) 40 MG tablet Take 40 mg by mouth at bedtime.     Marland Kitchen trimethoprim (TRIMPEX) 100 MG tablet Take 1 tablet by mouth daily.  11  . vitamin B-12 (CYANOCOBALAMIN) 1000 MCG tablet Take 1,000 mcg by mouth daily.    Marland Kitchen warfarin (COUMADIN) 5 MG tablet Take 1 tablet daily or as directed (Patient taking differently: Take 5 mg by mouth every evening. Take 1 tablet one day and 1/2 tablet the next day, and repeat.) 30 tablet 3   No current facility-administered medications for this visit.    REVIEW OF SYSTEMS:   Constitutional: Denies fevers, chills or abnormal night sweats Eyes: Denies blurriness of vision, double vision or watery eyes Ears, nose, mouth, throat, and face: Denies mucositis or sore throat Respiratory: Denies cough, dyspnea or wheezes Cardiovascular: Denies palpitation, chest discomfort or lower extremity swelling Gastrointestinal:  Denies nausea, heartburn or change in bowel habits Skin: Denies abnormal  skin rashes Neurological:Denies numbness, tingling or new weaknesses Behavioral/Psych: Mood is stable, no new changes  All other systems were reviewed with the patient and are negative.  PHYSICAL EXAMINATION: ECOG PERFORMANCE STATUS: 1 - Symptomatic but completely ambulatory  Filed Vitals:   02/03/16 1032  BP: 119/50  Pulse: 55  Temp: 97.7 F (36.5 C)  Resp: 18   Filed Weights   02/03/16 1032  Weight: 145 lb 14.4 oz (66.18 kg)    GENERAL:alert, no distress and comfortable SKIN: skin color, texture, turgor are normal, no rashes or significant lesions EYES: normal, conjunctiva are pink and non-injected, sclera clear OROPHARYNX:no exudate, no erythema and lips, buccal mucosa, and tongue normal  NECK: Well-healed surgical scar. No other palpable abnormalities  LYMPH:  no palpable lymphadenopathy in the cervical, axillary or inguinal LUNGS: clear to auscultation and percussion with normal breathing effort HEART: regular rate & rhythm and no murmurs and no lower extremity  edema ABDOMEN:abdomen soft, non-tender and normal bowel sounds Musculoskeletal:no cyanosis of digits and no clubbing  PSYCH: alert & oriented x 3 with fluent speech NEURO: no focal motor/sensory deficits  LABORATORY DATA:  I have reviewed the data as listed Lab Results  Component Value Date   WBC 7.7 09/26/2014   HGB 15.0 09/26/2014   HCT 44.6 09/26/2014   MCV 93.3 09/26/2014   PLT 198 09/26/2014   Lab Results  Component Value Date   NA 139 09/26/2014   K 3.8 09/26/2014   CL 104 09/26/2014   CO2 25 09/26/2014    RADIOGRAPHIC STUDIES:I reviewed outside PET/CT scan I have personally reviewed the radiological images as listed and agreed with the findings in the report.  ASSESSMENT:  Newly diagnosed squamous cell carcinoma of the Head & Neck, HPV Negative  PLAN:  Malignant neoplasm of head, face, and neck ([place]) Review her case extensively by reviewing outside records and presenting her case at ENT  tumor board. Overall consensus would be to proceed with EGD and biopsy to exclude probable concurrent esophageal malignancy causing left supraclavicular lymphadenopathy. If EGD and biopsy were negative, we have to assume that she had bilateral neck lymphadenopathy are from her primary head & neck cancer and she would need concurrent chemotherapy therapy. I'm concerned about recommending chemotherapy on this elderly woman with comorbidities including dementia. I recommend close follow-up with test results before proceed with plan for treatment.  Metastasis to supraclavicular lymph node ([facility]) The supraclavicular lymph node raise suspicion whether she might have primary GI malignancy, especially in view of abnormal PET imaging with abnormal uptake in the distal esophagus region. I recommend we proceed with EGD and random biopsy of the esophagus to exclude this possibility.  Alzheimer's dementia without behavioral disturbance The patient seems to be coping well with excellent family support. I'm concerned about recommending chemotherapy in this patient. Will proceed with EGD first before definitive plan of care for concurrent chemotherapy  Esophageal abnormality The patient has mild complain of occasional reflux. We'll proceed with EGD as above.     All questions were answered. The patient knows to call the clinic with any problems, questions or concerns. I spent 40 minutes counseling the patient face to face. The total time spent in the appointment was 60 minutes and more than 50% was on counseling.     [facility], [place], MD 02/04/2016 4:18 PM

## 2016-02-04 NOTE — Assessment & Plan Note (Signed)
The patient has mild complain of occasional reflux. We'll proceed with EGD as above.

## 2016-02-04 NOTE — Assessment & Plan Note (Signed)
The supraclavicular lymph node raise suspicion whether she might have primary GI malignancy, especially in view of abnormal PET imaging with abnormal uptake in the distal esophagus region. I recommend we proceed with EGD and random biopsy of the esophagus to exclude this possibility.

## 2016-02-04 NOTE — Progress Notes (Signed)
  Oncology Nurse Navigator Documentation  Navigator Location: [facility] (01/30/16 0945) Navigator Encounter Type: Initial RadOnc (01/30/16 0945)               Barriers/Navigation Needs: Education (01/30/16 0945) Education: Newly Diagnosed Cancer Education (01/30/16 0945)       Met with Ms. Davison during initial consult with Dr. Isidore Moos.  She was accompanied by her daughter Suszanne Conners.  1. Further introduced myself as her Navigator, explained my role as a member of the Care Team.   2. Provided New Patient Information packet, discussed contents:  Contact information for physician(s), myself, other members of the Care Team.  Advance Directive information ([place] blue pamphlet with LCSW contact info)  Fall Prevention Patient Safety Plan  Appointment [place] sheet  [place]  campus map with highlight of [place] 3. Provided introductory explanation of radiation treatment including SIM planning and purpose of Aquaplast head and shoulder mask, showed them example.   4. Provided a tour of SIM and [place] areas, explained treatment and arrival procedures. 5. I encouraged them to contact me with questions/concerns as treatments/procedures begin.  They verbalized understanding of information provided.    Gayleen Orem, RN, BSN, [place] at [place] [phone]                      Time Spent with Patient: 90 (01/30/16 0945)

## 2016-02-04 NOTE — Telephone Encounter (Signed)
Faxed letter to Dr. Roseanne Kaufman office from Dr. Alvy Bimler regarding request/ recommendation for EGD.  Staff says they received letter and are working on getting referral from pt's PCP since her insurance is requiring new referral at this time (last one expired in April).  They will get pt appt as soon as they can and will have Dr. Gala Romney call Dr. Alvy Bimler if he has any questions.

## 2016-02-04 NOTE — Assessment & Plan Note (Signed)
The patient seems to be coping well with excellent family support. I'm concerned about recommending chemotherapy in this patient. Will proceed with EGD first before definitive plan of care for concurrent chemotherapy

## 2016-02-04 NOTE — Assessment & Plan Note (Signed)
Review her case extensively by reviewing outside records and presenting her case at ENT tumor board. Overall consensus would be to proceed with EGD and biopsy to exclude probable concurrent esophageal malignancy causing left supraclavicular lymphadenopathy. If EGD and biopsy were negative, we have to assume that she had bilateral neck lymphadenopathy are from her primary head & neck cancer and she would need concurrent chemotherapy therapy. I'm concerned about recommending chemotherapy on this elderly woman with comorbidities including dementia. I recommend close follow-up with test results before proceed with plan for treatment.

## 2016-02-05 ENCOUNTER — Telehealth: Payer: Self-pay | Admitting: *Deleted

## 2016-02-05 ENCOUNTER — Encounter: Payer: Self-pay | Admitting: *Deleted

## 2016-02-05 ENCOUNTER — Ambulatory Visit: Payer: Medicare HMO | Attending: Radiation Oncology | Admitting: Physical Therapy

## 2016-02-05 NOTE — Telephone Encounter (Signed)
  Oncology Nurse Navigator Documentation                          Received call from Jaclyn Schultz's granddaughter, Jaclyn Schultz, who was calling on her grandmother's behalf.  Jaclyn Schultz has given a great deal of thought to information received from Drs. Isidore Moos and [place] regarding her treatment options.  She and family members would like to meet to further discuss treatment vs no treatment options.  I indicated I would inform Dr. Isidore Moos of the request, that dtr Cecelia would be contacted re an appointment.    I forwarded message to Dr. Isidore Moos.  Gayleen Orem, RN, BSN, [place] at [place] [phone]

## 2016-02-05 NOTE — Progress Notes (Signed)
A user error has taken place: encounter opened in error, closed for administrative reasons.

## 2016-02-10 ENCOUNTER — Telehealth: Payer: Self-pay | Admitting: *Deleted

## 2016-02-10 NOTE — Telephone Encounter (Signed)
CALLED PATIENT'S DAUGHTER, CECILIA REGALADO TO ASK ABOUT COMING IN FOR LABS ON 02-11-16 @ 4 PM, SPOKE WITH PATIENT'S CECILIA REGALADO AND SHE AGREED TO THIS.

## 2016-02-10 NOTE — Telephone Encounter (Signed)
XXXX 

## 2016-02-11 ENCOUNTER — Ambulatory Visit: Admission: RE | Admit: 2016-02-11 | Payer: Medicare HMO | Source: Ambulatory Visit | Admitting: Radiation Oncology

## 2016-02-11 ENCOUNTER — Telehealth: Payer: Self-pay | Admitting: *Deleted

## 2016-02-11 ENCOUNTER — Ambulatory Visit (INDEPENDENT_AMBULATORY_CARE_PROVIDER_SITE_OTHER): Payer: Medicare HMO | Admitting: *Deleted

## 2016-02-11 ENCOUNTER — Ambulatory Visit: Admission: RE | Admit: 2016-02-11 | Payer: Medicare HMO | Source: Ambulatory Visit

## 2016-02-11 ENCOUNTER — Ambulatory Visit
Admission: RE | Admit: 2016-02-11 | Discharge: 2016-02-11 | Disposition: A | Discharge status: Home / Self Care | Payer: Medicare HMO | Source: Ambulatory Visit | Attending: Radiation Oncology | Admitting: Radiation Oncology

## 2016-02-11 DIAGNOSIS — I4891 Unspecified atrial fibrillation: Secondary | ICD-10-CM

## 2016-02-11 DIAGNOSIS — Z7901 Long term (current) use of anticoagulants: Secondary | ICD-10-CM | POA: Diagnosis not present

## 2016-02-11 DIAGNOSIS — Z5181 Encounter for therapeutic drug level monitoring: Secondary | ICD-10-CM

## 2016-02-11 LAB — POCT INR: INR: 5

## 2016-02-11 NOTE — Telephone Encounter (Signed)
  Oncology Nurse Navigator Documentation  Navigator Location: [facility] (02/11/16 1630) Navigator Encounter Type: Telephone (02/11/16 1630) Telephone: [facility] Call (02/11/16 1630)             Barriers/Navigation Needs: Family concerns;Coordination of Care;Education (02/11/16 1630)   Interventions: Coordination of Care (02/11/16 1630)       Called patient's dtr Lorna Few in follow-up on this afternoon's missed lab and appt with Dr. Isidore Moos.  She explained her mother is overwhelmed with prospect of radiation and chemo, extraction of teeth, is convinced she has "cancer all over" (based on results of recent PET scan) and therefore has cancelled appts with her gastroenterologist, Dr. Enrique Sack, did not want to come today.  I shared this information with Dr. Isidore Moos after which I relayed to [facility] Dr. Pearlie Oyster proposal of radiation only, a radiation plan that will minimize radiation to remaining tooth roots; her recommendation that Dr. Enrique Sack fit her with scatter guards and fluoride trays to protect remaining teeth; recommendation that a radiation planning session be scheduled the week of 6/25 when they return from the beach.  Cecelia indicated she thinks her mother will find this approach acceptable and to proceed as proposed.  She understands I will call her with an appt for radiation planning.  Gayleen Orem, RN, BSN, [place] at [place] [phone]                        Time Spent with Patient: 45 (02/11/16 1630)

## 2016-02-13 ENCOUNTER — Telehealth: Payer: Self-pay | Admitting: *Deleted

## 2016-02-13 ENCOUNTER — Ambulatory Visit: Payer: Self-pay | Admitting: Internal Medicine

## 2016-02-13 NOTE — Telephone Encounter (Signed)
Pt will be out of town on the 21st and her daughter would like to know what she needs to do for her coumadin

## 2016-02-16 NOTE — Telephone Encounter (Signed)
Verified coumadin schedule with daughter.  Pt is to stay on that schedule until she comes back for INR check.  Daughter verbalized understanding.

## 2016-02-17 ENCOUNTER — Telehealth: Payer: Self-pay | Admitting: *Deleted

## 2016-02-17 NOTE — Telephone Encounter (Addendum)
  Oncology Nurse Navigator Documentation  Navigator Location: [facility] (02/17/16 1154) Navigator Encounter Type: Telephone (02/17/16 1154) Telephone: [facility] Crocker Call (02/17/16 1154)           Treatment Phase: Pre-Tx/Tx Discussion (02/17/16 1154)       Spoke with Ms Elting's dtr Cecelia to confirm her mother's wishes re pre-radiation dental procedures.  She verified Ms. Donna wants to avoid tooth extractions unless absolutely necessary, will proceed with fillings for dental caries.  I shared this update with Drs. [place] and [place].  Follow-up Addendum:  Ms. Molenaar continues to have intermittent pain with previously identified molar and would like to "fix it".  Gayleen Orem, RN, BSN, [place] at [place] [phone]                         Time Spent with Patient: 15 (02/17/16 1154)

## 2016-02-23 ENCOUNTER — Ambulatory Visit: Payer: Medicare HMO | Admitting: Physical Therapy

## 2016-02-23 ENCOUNTER — Telehealth: Payer: Self-pay | Admitting: *Deleted

## 2016-02-23 ENCOUNTER — Encounter: Payer: Self-pay | Admitting: *Deleted

## 2016-02-24 ENCOUNTER — Ambulatory Visit: Payer: Medicare HMO

## 2016-02-24 NOTE — Telephone Encounter (Signed)
  Oncology Nurse Navigator Documentation  Navigator Location: [facility] (02/23/16 1556) Navigator Encounter Type: Telephone (02/23/16 1556) Telephone: Incoming Call (02/23/16 1556)           Treatment Phase: Pre-Tx/Tx Discussion (02/23/16 1556)           Received VMM from Jaclyn Schultz's daughter Jaclyn Schultz indicating she has decided not to proceed with radiation or related dental treatments.  I have returned call x2 to obtain additional information with no success; will make further attempts.  Drs. Isidore Moos and [employer] informed.  Gayleen Orem, RN, BSN, [place] at [place] [phone]                     Time Spent with Patient: 15 (02/23/16 1556)

## 2016-02-25 ENCOUNTER — Encounter: Payer: Self-pay | Admitting: Nutrition

## 2016-02-25 ENCOUNTER — Ambulatory Visit (INDEPENDENT_AMBULATORY_CARE_PROVIDER_SITE_OTHER): Payer: Medicare HMO | Admitting: *Deleted

## 2016-02-25 DIAGNOSIS — Z7901 Long term (current) use of anticoagulants: Secondary | ICD-10-CM | POA: Diagnosis not present

## 2016-02-25 DIAGNOSIS — Z5181 Encounter for therapeutic drug level monitoring: Secondary | ICD-10-CM

## 2016-02-25 DIAGNOSIS — I4891 Unspecified atrial fibrillation: Secondary | ICD-10-CM

## 2016-02-25 LAB — POCT INR: INR: 3.4

## 2016-02-25 NOTE — Progress Notes (Signed)
Patient did not show up for nutrition appointment. 

## 2016-03-09 ENCOUNTER — Ambulatory Visit: Payer: Medicare HMO

## 2016-03-09 ENCOUNTER — Ambulatory Visit: Payer: Medicare HMO | Admitting: Physical Therapy

## 2016-03-10 ENCOUNTER — Ambulatory Visit (INDEPENDENT_AMBULATORY_CARE_PROVIDER_SITE_OTHER): Payer: Medicare HMO | Admitting: Pharmacist

## 2016-03-10 DIAGNOSIS — Z7901 Long term (current) use of anticoagulants: Secondary | ICD-10-CM

## 2016-03-10 DIAGNOSIS — Z5181 Encounter for therapeutic drug level monitoring: Secondary | ICD-10-CM | POA: Diagnosis not present

## 2016-03-10 LAB — POCT INR: INR: 2.2

## 2016-03-16 ENCOUNTER — Ambulatory Visit: Payer: Medicare HMO | Admitting: Internal Medicine

## 2016-03-24 ENCOUNTER — Ambulatory Visit (INDEPENDENT_AMBULATORY_CARE_PROVIDER_SITE_OTHER): Payer: Medicare HMO | Admitting: *Deleted

## 2016-03-24 DIAGNOSIS — Z5181 Encounter for therapeutic drug level monitoring: Secondary | ICD-10-CM | POA: Diagnosis not present

## 2016-03-24 DIAGNOSIS — Z7901 Long term (current) use of anticoagulants: Secondary | ICD-10-CM

## 2016-03-24 LAB — POCT INR: INR: 1.8

## 2016-04-14 ENCOUNTER — Ambulatory Visit (INDEPENDENT_AMBULATORY_CARE_PROVIDER_SITE_OTHER): Payer: Medicare HMO | Admitting: *Deleted

## 2016-04-14 DIAGNOSIS — Z7901 Long term (current) use of anticoagulants: Secondary | ICD-10-CM | POA: Diagnosis not present

## 2016-04-14 DIAGNOSIS — Z5181 Encounter for therapeutic drug level monitoring: Secondary | ICD-10-CM

## 2016-04-14 LAB — POCT INR: INR: 1.3

## 2016-04-16 ENCOUNTER — Other Ambulatory Visit (HOSPITAL_COMMUNITY): Payer: Self-pay | Admitting: Family Medicine

## 2016-04-16 ENCOUNTER — Ambulatory Visit (HOSPITAL_COMMUNITY)
Admission: RE | Admit: 2016-04-16 | Discharge: 2016-04-16 | Disposition: A | Discharge status: Home / Self Care | Payer: Medicare HMO | Source: Ambulatory Visit | Attending: Family Medicine | Admitting: Family Medicine

## 2016-04-16 DIAGNOSIS — M5440 Lumbago with sciatica, unspecified side: Secondary | ICD-10-CM

## 2016-04-16 DIAGNOSIS — M545 Low back pain: Secondary | ICD-10-CM | POA: Insufficient documentation

## 2016-04-16 DIAGNOSIS — M4186 Other forms of scoliosis, lumbar region: Secondary | ICD-10-CM | POA: Diagnosis not present

## 2016-04-16 DIAGNOSIS — M5136 Other intervertebral disc degeneration, lumbar region: Secondary | ICD-10-CM | POA: Diagnosis not present

## 2016-04-26 ENCOUNTER — Ambulatory Visit (INDEPENDENT_AMBULATORY_CARE_PROVIDER_SITE_OTHER): Payer: Medicare HMO | Admitting: *Deleted

## 2016-04-26 DIAGNOSIS — Z5181 Encounter for therapeutic drug level monitoring: Secondary | ICD-10-CM | POA: Diagnosis not present

## 2016-04-26 DIAGNOSIS — Z7901 Long term (current) use of anticoagulants: Secondary | ICD-10-CM

## 2016-04-26 LAB — POCT INR: INR: 2.5

## 2016-05-24 ENCOUNTER — Ambulatory Visit (INDEPENDENT_AMBULATORY_CARE_PROVIDER_SITE_OTHER): Payer: Medicare HMO | Admitting: *Deleted

## 2016-05-24 DIAGNOSIS — Z7901 Long term (current) use of anticoagulants: Secondary | ICD-10-CM | POA: Diagnosis not present

## 2016-05-24 DIAGNOSIS — Z5181 Encounter for therapeutic drug level monitoring: Secondary | ICD-10-CM | POA: Diagnosis not present

## 2016-05-24 LAB — POCT INR: INR: 2.7

## 2016-06-15 ENCOUNTER — Ambulatory Visit (HOSPITAL_COMMUNITY)
Admission: RE | Admit: 2016-06-15 | Discharge: 2016-06-15 | Disposition: A | Discharge status: Home / Self Care | Payer: Medicare HMO | Source: Ambulatory Visit | Attending: Family Medicine | Admitting: Family Medicine

## 2016-06-15 ENCOUNTER — Other Ambulatory Visit (HOSPITAL_COMMUNITY): Payer: Self-pay | Admitting: Family Medicine

## 2016-06-15 DIAGNOSIS — R52 Pain, unspecified: Secondary | ICD-10-CM

## 2016-06-15 DIAGNOSIS — M25561 Pain in right knee: Secondary | ICD-10-CM | POA: Insufficient documentation

## 2016-06-21 ENCOUNTER — Ambulatory Visit (INDEPENDENT_AMBULATORY_CARE_PROVIDER_SITE_OTHER): Payer: Medicare HMO | Admitting: *Deleted

## 2016-06-21 DIAGNOSIS — Z7901 Long term (current) use of anticoagulants: Secondary | ICD-10-CM

## 2016-06-21 DIAGNOSIS — Z5181 Encounter for therapeutic drug level monitoring: Secondary | ICD-10-CM | POA: Diagnosis not present

## 2016-06-21 LAB — POCT INR: INR: 2.1

## 2016-07-26 ENCOUNTER — Ambulatory Visit (INDEPENDENT_AMBULATORY_CARE_PROVIDER_SITE_OTHER): Payer: Medicare HMO | Admitting: *Deleted

## 2016-07-26 ENCOUNTER — Encounter: Payer: Self-pay | Admitting: *Deleted

## 2016-07-26 DIAGNOSIS — Z7901 Long term (current) use of anticoagulants: Secondary | ICD-10-CM | POA: Diagnosis not present

## 2016-07-26 DIAGNOSIS — Z5181 Encounter for therapeutic drug level monitoring: Secondary | ICD-10-CM

## 2016-07-26 LAB — POCT INR: INR: 1.4

## 2016-08-09 ENCOUNTER — Encounter: Payer: Self-pay | Admitting: *Deleted

## 2016-08-11 ENCOUNTER — Ambulatory Visit (INDEPENDENT_AMBULATORY_CARE_PROVIDER_SITE_OTHER): Payer: Medicare HMO | Admitting: *Deleted

## 2016-08-11 DIAGNOSIS — Z5181 Encounter for therapeutic drug level monitoring: Secondary | ICD-10-CM

## 2016-08-11 DIAGNOSIS — Z7901 Long term (current) use of anticoagulants: Secondary | ICD-10-CM

## 2016-08-11 LAB — POCT INR: INR: 2

## 2016-09-08 ENCOUNTER — Ambulatory Visit (INDEPENDENT_AMBULATORY_CARE_PROVIDER_SITE_OTHER): Payer: Medicare PPO | Admitting: *Deleted

## 2016-09-08 DIAGNOSIS — Z7901 Long term (current) use of anticoagulants: Secondary | ICD-10-CM

## 2016-09-08 DIAGNOSIS — Z5181 Encounter for therapeutic drug level monitoring: Secondary | ICD-10-CM

## 2016-09-08 LAB — POCT INR: INR: 1.8

## 2016-09-29 ENCOUNTER — Ambulatory Visit (INDEPENDENT_AMBULATORY_CARE_PROVIDER_SITE_OTHER): Payer: Medicare PPO | Admitting: *Deleted

## 2016-09-29 DIAGNOSIS — Z7901 Long term (current) use of anticoagulants: Secondary | ICD-10-CM | POA: Diagnosis not present

## 2016-09-29 DIAGNOSIS — Z5181 Encounter for therapeutic drug level monitoring: Secondary | ICD-10-CM

## 2016-09-29 LAB — POCT INR: INR: 2.4

## 2016-10-27 ENCOUNTER — Ambulatory Visit (INDEPENDENT_AMBULATORY_CARE_PROVIDER_SITE_OTHER): Payer: Medicare PPO | Admitting: *Deleted

## 2016-10-27 DIAGNOSIS — Z5181 Encounter for therapeutic drug level monitoring: Secondary | ICD-10-CM | POA: Diagnosis not present

## 2016-10-27 DIAGNOSIS — Z7901 Long term (current) use of anticoagulants: Secondary | ICD-10-CM | POA: Diagnosis not present

## 2016-10-27 LAB — POCT INR: INR: 2.3

## 2016-11-24 ENCOUNTER — Ambulatory Visit (INDEPENDENT_AMBULATORY_CARE_PROVIDER_SITE_OTHER): Payer: Medicare PPO | Admitting: *Deleted

## 2016-11-24 DIAGNOSIS — Z5181 Encounter for therapeutic drug level monitoring: Secondary | ICD-10-CM | POA: Diagnosis not present

## 2016-11-24 DIAGNOSIS — Z7901 Long term (current) use of anticoagulants: Secondary | ICD-10-CM

## 2016-11-24 LAB — POCT INR: INR: 2.2

## 2016-12-22 ENCOUNTER — Encounter: Payer: Self-pay | Admitting: *Deleted

## 2016-12-27 ENCOUNTER — Ambulatory Visit (INDEPENDENT_AMBULATORY_CARE_PROVIDER_SITE_OTHER): Payer: Medicare PPO | Admitting: *Deleted

## 2016-12-27 DIAGNOSIS — Z7901 Long term (current) use of anticoagulants: Secondary | ICD-10-CM

## 2016-12-27 DIAGNOSIS — Z5181 Encounter for therapeutic drug level monitoring: Secondary | ICD-10-CM | POA: Diagnosis not present

## 2016-12-27 LAB — POCT INR: INR: 2.3

## 2017-02-07 ENCOUNTER — Encounter: Payer: Self-pay | Admitting: *Deleted

## 2017-02-09 ENCOUNTER — Ambulatory Visit (INDEPENDENT_AMBULATORY_CARE_PROVIDER_SITE_OTHER): Payer: Medicare PPO | Admitting: *Deleted

## 2017-02-09 DIAGNOSIS — Z5181 Encounter for therapeutic drug level monitoring: Secondary | ICD-10-CM | POA: Diagnosis not present

## 2017-02-09 DIAGNOSIS — Z7901 Long term (current) use of anticoagulants: Secondary | ICD-10-CM | POA: Diagnosis not present

## 2017-02-09 LAB — POCT INR: INR: 2.8

## 2017-02-14 ENCOUNTER — Encounter (HOSPITAL_COMMUNITY): Payer: Self-pay | Admitting: Emergency Medicine

## 2017-02-14 ENCOUNTER — Emergency Department (HOSPITAL_COMMUNITY)
Admission: EM | Admit: 2017-02-14 | Discharge: 2017-02-14 | Disposition: A | Discharge status: Home / Self Care | Payer: Medicare PPO | Attending: Emergency Medicine | Admitting: Emergency Medicine

## 2017-02-14 ENCOUNTER — Emergency Department (HOSPITAL_COMMUNITY): Payer: Medicare PPO

## 2017-02-14 DIAGNOSIS — G309 Alzheimer's disease, unspecified: Secondary | ICD-10-CM | POA: Insufficient documentation

## 2017-02-14 DIAGNOSIS — C770 Secondary and unspecified malignant neoplasm of lymph nodes of head, face and neck: Secondary | ICD-10-CM | POA: Diagnosis not present

## 2017-02-14 DIAGNOSIS — Z87891 Personal history of nicotine dependence: Secondary | ICD-10-CM | POA: Diagnosis not present

## 2017-02-14 DIAGNOSIS — W19XXXA Unspecified fall, initial encounter: Secondary | ICD-10-CM | POA: Insufficient documentation

## 2017-02-14 DIAGNOSIS — Y92007 Garden or yard of unspecified non-institutional (private) residence as the place of occurrence of the external cause: Secondary | ICD-10-CM | POA: Insufficient documentation

## 2017-02-14 DIAGNOSIS — Y939 Activity, unspecified: Secondary | ICD-10-CM | POA: Insufficient documentation

## 2017-02-14 DIAGNOSIS — Z79899 Other long term (current) drug therapy: Secondary | ICD-10-CM | POA: Insufficient documentation

## 2017-02-14 DIAGNOSIS — C4442 Squamous cell carcinoma of skin of scalp and neck: Secondary | ICD-10-CM | POA: Insufficient documentation

## 2017-02-14 DIAGNOSIS — I10 Essential (primary) hypertension: Secondary | ICD-10-CM | POA: Diagnosis not present

## 2017-02-14 DIAGNOSIS — S335XXA Sprain of ligaments of lumbar spine, initial encounter: Secondary | ICD-10-CM | POA: Diagnosis not present

## 2017-02-14 DIAGNOSIS — S4992XA Unspecified injury of left shoulder and upper arm, initial encounter: Secondary | ICD-10-CM | POA: Diagnosis present

## 2017-02-14 DIAGNOSIS — Z7901 Long term (current) use of anticoagulants: Secondary | ICD-10-CM | POA: Insufficient documentation

## 2017-02-14 DIAGNOSIS — Y999 Unspecified external cause status: Secondary | ICD-10-CM | POA: Insufficient documentation

## 2017-02-14 DIAGNOSIS — S40012A Contusion of left shoulder, initial encounter: Secondary | ICD-10-CM | POA: Insufficient documentation

## 2017-02-14 DIAGNOSIS — S6391XA Sprain of unspecified part of right wrist and hand, initial encounter: Secondary | ICD-10-CM | POA: Diagnosis not present

## 2017-02-14 MED ORDER — HYDROMORPHONE HCL 1 MG/ML IJ SOLN
1.0000 mg | Freq: Once | INTRAMUSCULAR | Status: AC
  Administered 2017-02-14: 1 mg via INTRAMUSCULAR
  Filled 2017-02-14: qty 1

## 2017-02-14 MED ORDER — TRAMADOL HCL 50 MG PO TABS: 50.0000 mg | ORAL_TABLET | Freq: Four times a day (QID) | ORAL | 0 refills | Status: DC | PRN

## 2017-02-14 MED ORDER — ONDANSETRON 4 MG PO TBDP
4.0000 mg | ORAL_TABLET | Freq: Once | ORAL | Status: AC
  Administered 2017-02-14: 4 mg via ORAL
  Filled 2017-02-14: qty 1

## 2017-02-14 NOTE — ED Notes (Signed)
Pt a&o x 4, RX/verbal/written discharge instructions given, pt and dtr verbalized understanding, pt up to wheelchair and taken off unit in stable condition with steady gait

## 2017-02-14 NOTE — ED Triage Notes (Addendum)
Pt states that she was pulling weeds and fell over a rock wall.  Pt states that she is having pain in the left shoulder, right hand, and lower back.  Pt denies hitting her head and losing consciousness.

## 2017-02-14 NOTE — ED Provider Notes (Signed)
[facility] DEPT Provider Note   CSN: [id number] Arrival date & time: 02/14/17  1704     History   Chief Complaint Chief Complaint  Patient presents with  . Fall    HPI Jaclyn Schultz is a 83 y.o. female.    Patient fell in her backyard. She hurts in her left shoulder right hand and lumbar spine, no loss of consciousness   The history is provided by the patient. No language interpreter was used.  Fall  This is a new problem. The current episode started 12 to 24 hours ago. The problem occurs rarely. The problem has been resolved. Pertinent negatives include no chest pain, no abdominal pain and no headaches. Nothing aggravates the symptoms. Nothing relieves the symptoms.    Past Medical History:  Diagnosis Date  . A-fib ([place])   . Cancer ([place])    mouth/dx 08-2010/surg only  . Dementia    Early  . High cholesterol   . Hypertension   . Osteoporosis   . Vertigo   . Vitamin B 12 deficiency     Patient Active Problem List   Diagnosis Date Noted  . 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 Esophageal abnormality 02/03/2016  . Alzheimer's dementia without behavioral disturbance 02/03/2016  . 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 Diarrhea 06/30/2015  . FH: colon cancer 06/30/2015  . Foot fracture, left 10/09/2013  . Encounter for therapeutic drug monitoring 09/26/2013  . Metatarsal fracture 08/14/2013  . Bronchitis, acute 07/12/2013  . SOB (shortness of breath) 07/12/2013  . 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 Chronic periodontitis, unspecified 03/22/2013  . Allergic rhinitis due to pollen 03/22/2013  . 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 Malignant neoplasm of head, face, and neck ([place]) 03/22/2013  . Other B-complex deficiencies 03/22/2013  . Mixed hyperlipidemia 03/22/2013  . Essential hypertension, benign 03/22/2013  . Reflux  esophagitis 03/22/2013  . Senile osteoporosis 03/22/2013  . Atrial fibrillation, chronic ([place]) 11/05/2012  . Long term (current) use of anticoagulants 11/05/2012    Past Surgical History:  Procedure Laterality Date  . APPENDECTOMY    . BIOPSY N/A 07/08/2015   Procedure: BIOPSY;  Surgeon: Daneil Dolin, MD;  Location: [facility] SUITE;  Service: Endoscopy;  Laterality: N/A;  possible random colon biopsies  . CARDIAC CATHETERIZATION  10/2009   normal coronary arteries  . CERVICAL DISC SURGERY    . COLONOSCOPY  05/27/2004   RSW:NIOEVOJJ hemorrhoids/sigmoid diverticula  . COLONOSCOPY N/A 07/08/2015   Procedure: COLONOSCOPY;  Surgeon: Daneil Dolin, MD;  Location: [facility] SUITE;  Service: Endoscopy;  Laterality: N/A;  0093  . 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 MANDIBLE SURGERY    . SHOULDER SURGERY  remote    OB History    No data available       Home Medications    Prior to Admission medications   Medication Sig Start Date End Date Taking? Authorizing Provider  Calcium Citrate-Vitamin D (CALCIUM + D PO) Take 600 mg by mouth daily.   Yes [provider]  Cetirizine HCl (ZYRTEC ALLERGY) 10 MG CAPS Take by mouth daily.   Yes [provider]  vitamin B-12 (CYANOCOBALAMIN) 1000 MCG tablet Take 1,000 mcg by mouth daily.   Yes [provider]  donepezil (ARICEPT) 5 MG tablet Take 5 mg by mouth at bedtime.    [provider]  hydrochlorothiazide (HYDRODIURIL) 25 MG tablet Take 12.5 mg by mouth daily.    [provider]  losartan (COZAAR) 100 MG tablet Take 1 tablet (100 mg total) by mouth daily. 03/23/13   Herminio Commons, MD  metoprolol tartrate (LOPRESSOR) 25 MG tablet Take 25 mg by mouth 2 (two) times daily.     [provider]  potassium chloride (KLOR-CON) 10 MEQ CR tablet Take 10 mEq by mouth daily.     [provider]  simvastatin (ZOCOR) 40 MG tablet Take 40 mg by mouth at bedtime.     [provider]  traMADol (ULTRAM) 50 MG tablet Take 1 tablet (50 mg total) by mouth every 6 (six) hours as needed. 02/14/17   Milton Ferguson, MD  trimethoprim (TRIMPEX) 100 MG tablet Take 1 tablet by mouth daily. 05/14/15   [provider]  warfarin (COUMADIN) 5 MG tablet Take 1 tablet daily or as directed Patient taking differently: Take 5 mg by mouth every evening. Take 1 tablet one day and 1/2 tablet the next day, and repeat. 09/10/14   Herminio Commons, MD    Family History Family History  Problem Relation Age of Onset  . Colon cancer Sister        age 65    Social History Social History  Substance Use Topics  . Smoking status: Never Smoker  . Smokeless tobacco: Former Systems developer    Types: [place] date: 02/27/2013     Comment: Never smoked  . Alcohol use No     Allergies   Erythromycin base   Review of Systems Review of Systems  Constitutional: Negative for appetite change and fatigue.  HENT: Negative for congestion, ear discharge and sinus pressure.   Eyes: Negative for discharge.  Respiratory: Negative for cough.   Cardiovascular: Negative for chest pain.  Gastrointestinal: Negative for abdominal pain and diarrhea.  Genitourinary: Negative for frequency and hematuria.  Musculoskeletal: Positive for back pain.       Patient has pain in left shoulder right hand lumbar spine  Skin: Negative for rash.  Neurological: Negative for seizures and headaches.  Psychiatric/Behavioral: Negative for hallucinations.     Physical Exam Updated Vital Signs BP 131/71 (BP Location: Left Arm)   Pulse 61   Temp 97.7 F (36.5 C) (Oral)   Resp (!) 22   Ht 5\' 4"  (1.626 m)   Wt 73.5 kg (162 lb)   SpO2 100%   BMI 27.81 kg/m   Physical Exam  Constitutional: She is oriented to person, place, and time. She appears well-developed.  HENT:  Head: Normocephalic.  Eyes: Conjunctivae and EOM are normal. No scleral icterus.  Neck: Neck supple. No thyromegaly present.    Cardiovascular: Normal rate and regular rhythm.  Exam reveals no gallop and no friction rub.   No murmur heard. Pulmonary/Chest: No stridor. She has no wheezes. She has no rales. She exhibits no tenderness.  Abdominal: She exhibits no distension. There is no tenderness. There is no rebound.  Musculoskeletal: Normal range of motion. She exhibits no edema.  Tenderness to lumbar spine around L1-L2. Patient also has minor tenderness to left shoulder but full range of motion and neurovascular exam normal left upper extremity. Patient has moderate tenderness to right middle finger and right index and ring fingers.  Lymphadenopathy:    She has no cervical adenopathy.  Neurological: She is oriented to person, place, and time. She exhibits normal muscle tone. Coordination normal.  Skin: No  rash noted. No erythema.  Psychiatric: She has a normal mood and affect. Her behavior is normal.     ED Treatments / Results  Labs (all labs ordered are listed, but only abnormal results are displayed) Labs Reviewed - No data to display  EKG  EKG Interpretation None       Radiology Dg Lumbar Spine Complete  Result Date: 02/14/2017 CLINICAL DATA:  Status post fall, with lower back pain. Initial encounter. EXAM: LUMBAR SPINE - COMPLETE 4+ VIEW COMPARISON:  Lumbar spine radiographs performed 04/16/2016 FINDINGS: There is no evidence of acute fracture or subluxation. There is grade 1 retrolisthesis of L2 on L3. Vertebral bodies demonstrate normal height. Intervertebral disc spaces are preserved. Small anterior osteophytes are noted along the lumbar spine. The visualized bowel gas pattern is unremarkable in appearance; air and stool are noted within the colon. The sacroiliac joints are within normal limits. IMPRESSION: 1. No evidence of acute fracture or subluxation along the lumbar spine. 2. Minimal degenerative change along the lumbar spine, with mild grade 1 retrolisthesis of L2 on L3. Electronically Signed    By: Garald Balding M.D.   On: 02/14/2017 18:33   Dg Shoulder Left  Result Date: 02/14/2017 CLINICAL DATA:  Status post fall, with left shoulder pain. Initial encounter. EXAM: LEFT SHOULDER - 2+ VIEW COMPARISON:  None. FINDINGS: There is no evidence of fracture or dislocation. The left humeral head is seated within the glenoid fossa. Minimal degenerative change is noted at the left acromioclavicular joint. No significant soft tissue abnormalities are seen. The visualized portions of the left lung are clear. IMPRESSION: No evidence of fracture or dislocation. Electronically Signed   By: Garald Balding M.D.   On: 02/14/2017 18:34   Dg Hand Complete Right  Result Date: 02/14/2017 CLINICAL DATA:  Status post fall, landing on rocks. Right hand pain. Initial encounter. EXAM: RIGHT HAND - COMPLETE 3+ VIEW COMPARISON:  None. FINDINGS: There is no evidence of fracture or dislocation. Degenerative joint space narrowing and volar subluxation is noted at the third metacarpophalangeal joint. Degenerative change is noted at the first carpometacarpal joint. Mild cortical irregularity at the third dorsal metacarpal head is likely degenerative in nature. The carpal rows are otherwise intact, and demonstrate normal alignment. Negative ulnar variance is noted. The soft tissues are unremarkable in appearance. IMPRESSION: 1. No evidence of fracture or dislocation. 2. Joint space narrowing and volar subluxation at the third metacarpophalangeal joint. Would correlate clinically as to whether this is a chronic finding. This may reflect underlying arthritis. 3. Osteoarthritis at the first carpometacarpal joint. Electronically Signed   By: Garald Balding M.D.   On: 02/14/2017 18:31    Procedures Procedures (including critical care time)  Medications Ordered in ED Medications  HYDROmorphone (DILAUDID) injection 1 mg (1 mg Intramuscular Given 02/14/17 1931)  ondansetron (ZOFRAN-ODT) disintegrating tablet 4 mg (4 mg Oral Given  02/14/17 1931)     Initial Impression / Assessment and Plan / ED Course  I have reviewed the triage vital signs and the nursing notes.  Pertinent labs & imaging results that were available during my care of the patient were reviewed by me and considered in my medical decision making (see chart for details).     X-rays unremarkable. Patient had a fall with contusion to left shoulder right hand and lumbar sprain. Patient given Ultram will follow-up with PCP  Final Clinical Impressions(s) / ED Diagnoses   Final diagnoses:  Fall, initial encounter    New Prescriptions New Prescriptions  TRAMADOL (ULTRAM) 50 MG TABLET    Take 1 tablet (50 mg total) by mouth every 6 (six) hours as needed.     Milton Ferguson, MD 02/14/17 2047

## 2017-02-14 NOTE — Discharge Instructions (Signed)
Follow up with your md this week for recheck  °

## 2017-02-22 ENCOUNTER — Other Ambulatory Visit (HOSPITAL_COMMUNITY): Payer: Self-pay | Admitting: Family Medicine

## 2017-02-22 ENCOUNTER — Encounter (HOSPITAL_COMMUNITY): Payer: Self-pay

## 2017-02-22 ENCOUNTER — Ambulatory Visit (HOSPITAL_COMMUNITY)
Admission: RE | Admit: 2017-02-22 | Discharge: 2017-02-22 | Disposition: A | Discharge status: Home / Self Care | Payer: Medicare PPO | Source: Ambulatory Visit | Attending: Family Medicine | Admitting: Family Medicine

## 2017-02-22 DIAGNOSIS — R52 Pain, unspecified: Secondary | ICD-10-CM

## 2017-02-22 DIAGNOSIS — M47812 Spondylosis without myelopathy or radiculopathy, cervical region: Secondary | ICD-10-CM | POA: Diagnosis not present

## 2017-02-22 DIAGNOSIS — M4184 Other forms of scoliosis, thoracic region: Secondary | ICD-10-CM | POA: Diagnosis not present

## 2017-02-22 DIAGNOSIS — X58XXXA Exposure to other specified factors, initial encounter: Secondary | ICD-10-CM | POA: Insufficient documentation

## 2017-02-22 DIAGNOSIS — M4854XA Collapsed vertebra, not elsewhere classified, thoracic region, initial encounter for fracture: Secondary | ICD-10-CM | POA: Insufficient documentation

## 2017-02-22 DIAGNOSIS — S22000A Wedge compression fracture of unspecified thoracic vertebra, initial encounter for closed fracture: Secondary | ICD-10-CM

## 2017-02-23 ENCOUNTER — Other Ambulatory Visit: Payer: Self-pay | Admitting: Family Medicine

## 2017-02-23 ENCOUNTER — Ambulatory Visit (HOSPITAL_COMMUNITY)
Admission: RE | Admit: 2017-02-23 | Discharge: 2017-02-23 | Disposition: A | Discharge status: Home / Self Care | Payer: Medicare PPO | Source: Ambulatory Visit | Attending: Family Medicine | Admitting: Family Medicine

## 2017-02-23 DIAGNOSIS — Y939 Activity, unspecified: Secondary | ICD-10-CM | POA: Diagnosis not present

## 2017-02-23 DIAGNOSIS — M5124 Other intervertebral disc displacement, thoracic region: Secondary | ICD-10-CM | POA: Insufficient documentation

## 2017-02-23 DIAGNOSIS — X58XXXA Exposure to other specified factors, initial encounter: Secondary | ICD-10-CM | POA: Insufficient documentation

## 2017-02-23 DIAGNOSIS — S22000A Wedge compression fracture of unspecified thoracic vertebra, initial encounter for closed fracture: Secondary | ICD-10-CM | POA: Diagnosis not present

## 2017-02-23 DIAGNOSIS — M546 Pain in thoracic spine: Secondary | ICD-10-CM

## 2017-02-24 ENCOUNTER — Ambulatory Visit
Admission: RE | Admit: 2017-02-24 | Discharge: 2017-02-24 | Disposition: A | Discharge status: Home / Self Care | Payer: Medicare PPO | Source: Ambulatory Visit | Attending: Family Medicine | Admitting: Family Medicine

## 2017-02-24 ENCOUNTER — Other Ambulatory Visit: Payer: Self-pay | Admitting: Interventional Radiology

## 2017-02-24 DIAGNOSIS — S22000A Wedge compression fracture of unspecified thoracic vertebra, initial encounter for closed fracture: Secondary | ICD-10-CM

## 2017-02-24 DIAGNOSIS — M546 Pain in thoracic spine: Secondary | ICD-10-CM

## 2017-02-24 HISTORY — PX: IR RADIOLOGIST EVAL & MGMT: IMG5224

## 2017-02-24 NOTE — Consult Note (Signed)
Chief Complaint: Patient was seen in consultation today for thoracic spine pain at the request of Knowlton,Steve  Referring Physician(s): Knowlton,Steve  History of Present Illness: Jaclyn Schultz is a 83 y.o. female with mild dementia who was in her usual state of relatively good health when she fell while bending over to pluck weeds out of her garden. She developed acute onset of thoracic spine pain. Workup revealed an acute T12 compression fracture.  Prior to this event, she was extremely active for her age. Now, she spends almost the entire day either sitting down in the chair, or laying down in her bed. She experiences severe grabbing pain in her back that radiates around her rib cage when moving and changing positions. She is currently taking hydrocodone for the pain which helps somewhat but has resulted in slight exacerbation of her dementia in the form of hallucinations.  She denies chest pain, shortness of breath or history of prior stroke. She is on Coumadin for stroke prophylaxis in the setting of chronic atrial fibrillation.  Past Medical History:  Diagnosis Date  . A-fib ([place])   . Cancer ([place])    mouth/dx 08-2010/surg only  . Dementia    Early  . High cholesterol   . Hypertension   . Osteoporosis   . Vertigo   . Vitamin B 12 deficiency     Past Surgical History:  Procedure Laterality Date  . APPENDECTOMY    . BIOPSY N/A 07/08/2015   Procedure: BIOPSY;  Surgeon: Daneil Dolin, MD;  [facility];  Service: Endoscopy;  Laterality: N/A;  possible random colon biopsies  . CARDIAC CATHETERIZATION  10/2009   normal coronary arteries  . CERVICAL DISC SURGERY    . COLONOSCOPY  05/27/2004   ERD:[id number] hemorrhoids/sigmoid diverticula  . COLONOSCOPY N/A 07/08/2015   Procedure: COLONOSCOPY;  Surgeon: Daneil Dolin, MD;  [facility]: [facility];  Service: Endoscopy;  Laterality: N/A;  [id number]  . 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 MANDIBLE SURGERY     . SHOULDER SURGERY  remote    Allergies: Erythromycin base  Medications: Prior to Admission medications   Medication Sig Start Date End Date Taking? Authorizing Provider  Calcium Citrate-Vitamin D (CALCIUM + D PO) Take 600 mg by mouth daily.   Yes [provider]  Cetirizine HCl (ZYRTEC ALLERGY) 10 MG CAPS Take by mouth daily.   Yes [provider]  docusate sodium (COLACE) 100 MG capsule Take 100 mg by mouth 2 (two) times daily.   Yes [provider]  donepezil (ARICEPT) 5 MG tablet Take 5 mg by mouth at bedtime.   Yes [provider]  hydrochlorothiazide (HYDRODIURIL) 25 MG tablet Take 12.5 mg by mouth daily.   Yes [provider]  HYDROcodone-acetaminophen (NORCO/VICODIN) 5-325 MG tablet Take 1 tablet by mouth every 6 (six) hours as needed for moderate pain.   Yes [provider]  losartan (COZAAR) 100 MG tablet Take 1 tablet (100 mg total) by mouth daily. 03/23/13  Yes Herminio Commons, MD  metoprolol tartrate (LOPRESSOR) 25 MG tablet Take 25 mg by mouth 2 (two) times daily.    Yes [provider]  potassium chloride (KLOR-CON) 10 MEQ CR tablet Take 10 mEq by mouth daily.    Yes [provider]  simvastatin (ZOCOR) 40 MG tablet Take 40 mg by mouth at bedtime.    Yes [provider]  trimethoprim (TRIMPEX) 100 MG tablet Take 1 tablet  by mouth daily. 05/14/15  Yes [provider]  vitamin B-12 (CYANOCOBALAMIN) 1000 MCG tablet Take 1,000 mcg by mouth daily.   Yes [provider]  warfarin (COUMADIN) 5 MG tablet Take 1 tablet daily or as directed Patient taking differently: Take 5 mg by mouth every evening. Take 1 tablet one day and 1/2 tablet the next day, and repeat. 09/10/14  Yes Herminio Commons, MD     Family History  Problem Relation Age of Onset  . Colon cancer Sister        age 69    Social History   Social History  . Marital status: Widowed    Spouse name: N/A  .  Number of children: 5  . Years of education: N/A   Social History Main Topics  . Smoking status: Never Smoker  . Smokeless tobacco: Former Systems developer    Types: [place] date: 02/27/2013     Comment: Never smoked  . Alcohol use No  . Drug use: No  . Sexual activity: No   Other Topics Concern  . None   Social History Narrative  . None     Review of Systems: A 12 point ROS discussed and pertinent positives are indicated in the HPI above.  All other systems are negative.  Review of Systems  Vital Signs: BP (!) 86/52 (BP Location: Right Arm, Patient Position: Sitting, Cuff Size: Normal)   Pulse 66   Temp 98 F (36.7 C)   Resp 16   Ht 5\' 4"  (1.626 m)   Wt 162 lb (73.5 kg)   SpO2 96%   BMI 27.81 kg/m   Physical Exam  Constitutional: She is oriented to person, place, and time. She appears well-developed and well-nourished. No distress.  HENT:  Head: Normocephalic and atraumatic.  Eyes: No scleral icterus.  Cardiovascular: Normal rate.   Pulmonary/Chest: Effort normal.  Abdominal: Soft. She exhibits no distension. There is no tenderness.  Musculoskeletal:       Thoracic back: She exhibits tenderness.       Back:  TTP at the T12 spinous process  Neurological: She is alert and oriented to person, place, and time.  Skin: Skin is warm and dry.  Nursing note and vitals reviewed.    Imaging: Dg Thoracic Spine W/swimmers  Result Date: 02/22/2017 CLINICAL DATA:  Back pain.  Injury. EXAM: THORACIC SPINE - 3 VIEWS COMPARISON:  Lumbar spine 02/14/2017. Chest x-ray 12/16/2014 . CT 09/26/2014. FINDINGS: Thoracic spine scoliosis and degenerative change. A new compression fracture T12 noted. Vertebrae are numbered with the lowest segmented appearing lumbar shaped vertebra as L5. This is new from prior studies. IMPRESSION: New moderate compression fracture T12 . Electronically Signed   By: Marcello Moores  Register   On: 02/22/2017 12:43   Dg Lumbar Spine Complete  Result Date:  02/22/2017 CLINICAL DATA:  Fall.  Pain. EXAM: LUMBAR SPINE - COMPLETE 4+ VIEW COMPARISON:  02/14/2017. FINDINGS: Vertebra numbered with the lowest lumbar shaped segmented vertebra as L5 . New moderate T12 compression fracture noted. Diffuse degenerative change present. Radiopacity noted left upper quadrant most likely pill fragment. Gallstones cannot be excluded. IMPRESSION: New moderate T12 compression fracture . Electronically Signed   By: Marcello Moores  Register   On: 02/22/2017 12:47   Dg Lumbar Spine Complete  Result Date: 02/14/2017 CLINICAL DATA:  Status post fall, with lower back pain. Initial encounter. EXAM: LUMBAR SPINE - COMPLETE 4+ VIEW COMPARISON:  Lumbar spine radiographs performed 04/16/2016 FINDINGS: There is no evidence of  acute fracture or subluxation. There is grade 1 retrolisthesis of L2 on L3. Vertebral bodies demonstrate normal height. Intervertebral disc spaces are preserved. Small anterior osteophytes are noted along the lumbar spine. The visualized bowel gas pattern is unremarkable in appearance; air and stool are noted within the colon. The sacroiliac joints are within normal limits. IMPRESSION: 1. No evidence of acute fracture or subluxation along the lumbar spine. 2. Minimal degenerative change along the lumbar spine, with mild grade 1 retrolisthesis of L2 on L3. Electronically Signed   By: Garald Balding M.D.   On: 02/14/2017 18:33   Mr Thoracic Spine Wo Contrast  Result Date: 02/23/2017 CLINICAL DATA:  Nine day history of back pain after falling. T12 compression fracture noted on radiographs from yesterday. EXAM: MRI THORACIC SPINE WITHOUT CONTRAST TECHNIQUE: Multiplanar, multisequence MR imaging of the thoracic spine was performed. No intravenous contrast was administered. COMPARISON:  Radiographs 02/22/2017 and chest CT 09/26/2014 FINDINGS: Alignment:  Normal Vertebrae: There is a acute compression fracture of the T12 vertebral body, mainly involving the inferior endplate. Some  preserved T1 marrow signal. Approximately 25% compression. Minimal retropulsion but no canal compromise. The other thoracic vertebral bodies are maintained. There are surgical changes noted in the cervical spine with anterior and interbody fusions at C4-5 and interbody fusions at C5-6 and C6-7. Cord: Normal wall cord signal intensity. No cord lesions or syrinx. Paraspinal and other soft tissues: No significant findings. Disc levels: Right-sided disc osteophyte complex at C7-T1. There is mild mass effect on the right side of the thecal sac and mild right foraminal stenosis possibly irritating the right C8 nerve root. Small central disc protrusions noted at T1-2, T2-3, T3-4 and T4-5. Shallow broad-based disc protrusion and bulging disc at T5-6. Small focal central disc protrusion at T6-7. Shallow broad-based right paracentral disc protrusion at T7-8. Mild mass effect on the thecal sac. Tiny central disc protrusion at T8-9 Mild retropulsion noted at T12. There is slight flattening of the disc at T12-L1 but no significant spinal or foraminal stenosis. Mild lateral recess encroachment. IMPRESSION: 1. Acute compression fracture of T12 with minimal retropulsion but no canal compromise. Mild flattening of the ventral thecal sac and mild lateral recess encroachment. 2. Shallow multilevel disc protrusions as described above. T7-8 appears to be the most significant level. 3. Normal MR appearance of the thoracic spinal cord. 4. Postsurgical changes involving the cervical spine. Electronically Signed   By: Marijo Sanes M.D.   On: 02/23/2017 11:24   Dg Shoulder Left  Result Date: 02/14/2017 CLINICAL DATA:  Status post fall, with left shoulder pain. Initial encounter. EXAM: LEFT SHOULDER - 2+ VIEW COMPARISON:  None. FINDINGS: There is no evidence of fracture or dislocation. The left humeral head is seated within the glenoid fossa. Minimal degenerative change is noted at the left acromioclavicular joint. No significant soft  tissue abnormalities are seen. The visualized portions of the left lung are clear. IMPRESSION: No evidence of fracture or dislocation. Electronically Signed   By: Garald Balding M.D.   On: 02/14/2017 18:34   Dg Hand Complete Right  Result Date: 02/14/2017 CLINICAL DATA:  Status post fall, landing on rocks. Right hand pain. Initial encounter. EXAM: RIGHT HAND - COMPLETE 3+ VIEW COMPARISON:  None. FINDINGS: There is no evidence of fracture or dislocation. Degenerative joint space narrowing and volar subluxation is noted at the third metacarpophalangeal joint. Degenerative change is noted at the first carpometacarpal joint. Mild cortical irregularity at the third dorsal metacarpal head is likely degenerative in nature.  The carpal rows are otherwise intact, and demonstrate normal alignment. Negative ulnar variance is noted. The soft tissues are unremarkable in appearance. IMPRESSION: 1. No evidence of fracture or dislocation. 2. Joint space narrowing and volar subluxation at the third metacarpophalangeal joint. Would correlate clinically as to whether this is a chronic finding. This may reflect underlying arthritis. 3. Osteoarthritis at the first carpometacarpal joint. Electronically Signed   By: Garald Balding M.D.   On: 02/14/2017 18:31   Dg Hip Unilat With Pelvis 2-3 Views Left  Result Date: 02/22/2017 CLINICAL DATA:  Fall.  Pain. EXAM: DG HIP (WITH OR WITHOUT PELVIS) 2-3V LEFT COMPARISON:  Lumbar spine 02/14/2017 . FINDINGS: Degenerative changes lumbar spine and both hips. No acute bony or joint abnormality identified. No evidence of hip fracture or dislocation. IMPRESSION: No acute abnormality. Electronically Signed   By: Marcello Moores  Register   On: 02/22/2017 12:48    Labs:  CBC: No results for input(s): WBC, HGB, HCT, PLT in the last 8760 hours.  COAGS:  Recent Labs  10/27/16 1034 11/24/16 1047 12/27/16 1105 02/09/17 0856  INR 2.3 2.2 2.3 2.8    BMP: No results for input(s): NA, K, CL, CO2,  GLUCOSE, BUN, CALCIUM, CREATININE, GFRNONAA, GFRAA in the last 8760 hours.  Invalid input(s): CMP  LIVER FUNCTION TESTS: No results for input(s): BILITOT, AST, ALT, ALKPHOS, PROT, ALBUMIN in the last 8760 hours.  TUMOR MARKERS: No results for input(s): AFPTM, CEA, CA199, CHROMGRNA in the last 8760 hours.  Assessment and Plan:  83 year old female with an acute T12 compression fracture. She is having significant pain despite narcotic medication and has had a significant decrease in activity. Unfortunately, the narcotic pain medications are leading to an exacerbation of her underlying dementia with some hallucinations. Additionally, she is at risk for deconditioning and strength loss, she is spending most of the day sitting or laying down.  She is an excellent candidate for cement augmentation which should result in improvement in her clinical symptoms and allow her to resume her normal activities.  1.) Stop taking Coumadin today.  2.) We will schedule her for a T12 cement augmentation next week on the 5th at [facility].   Thank you for this interesting consult.  I greatly enjoyed meeting Jaclyn Schultz and look forward to participating in their care.  A copy of this report was sent to the requesting provider on this date.  Electronically Signed: Jacqulynn Cadet 02/24/2017, 10:21 AM  I spent a total of  40 Minutes   in face to face in clinical consultation, greater than 50% of which was counseling/coordinating care for acute T12 fracture.

## 2017-03-01 ENCOUNTER — Other Ambulatory Visit: Payer: Self-pay | Admitting: General Surgery

## 2017-03-03 ENCOUNTER — Other Ambulatory Visit: Payer: Self-pay | Admitting: Interventional Radiology

## 2017-03-03 ENCOUNTER — Ambulatory Visit (HOSPITAL_COMMUNITY)
Admission: RE | Admit: 2017-03-03 | Discharge: 2017-03-03 | Disposition: A | Discharge status: Home / Self Care | Payer: Medicare PPO | Source: Ambulatory Visit | Attending: Interventional Radiology | Admitting: Interventional Radiology

## 2017-03-03 ENCOUNTER — Encounter (HOSPITAL_COMMUNITY): Payer: Self-pay

## 2017-03-03 DIAGNOSIS — M810 Age-related osteoporosis without current pathological fracture: Secondary | ICD-10-CM | POA: Insufficient documentation

## 2017-03-03 DIAGNOSIS — I10 Essential (primary) hypertension: Secondary | ICD-10-CM | POA: Diagnosis not present

## 2017-03-03 DIAGNOSIS — S22089A Unspecified fracture of T11-T12 vertebra, initial encounter for closed fracture: Secondary | ICD-10-CM | POA: Diagnosis not present

## 2017-03-03 DIAGNOSIS — F0390 Unspecified dementia without behavioral disturbance: Secondary | ICD-10-CM | POA: Diagnosis not present

## 2017-03-03 DIAGNOSIS — E538 Deficiency of other specified B group vitamins: Secondary | ICD-10-CM | POA: Diagnosis not present

## 2017-03-03 DIAGNOSIS — Z7901 Long term (current) use of anticoagulants: Secondary | ICD-10-CM | POA: Insufficient documentation

## 2017-03-03 DIAGNOSIS — S22000A Wedge compression fracture of unspecified thoracic vertebra, initial encounter for closed fracture: Secondary | ICD-10-CM

## 2017-03-03 DIAGNOSIS — W19XXXA Unspecified fall, initial encounter: Secondary | ICD-10-CM | POA: Insufficient documentation

## 2017-03-03 DIAGNOSIS — I480 Paroxysmal atrial fibrillation: Secondary | ICD-10-CM | POA: Diagnosis not present

## 2017-03-03 DIAGNOSIS — Y92009 Unspecified place in unspecified non-institutional (private) residence as the place of occurrence of the external cause: Secondary | ICD-10-CM | POA: Insufficient documentation

## 2017-03-03 DIAGNOSIS — E7800 Pure hypercholesterolemia, unspecified: Secondary | ICD-10-CM | POA: Insufficient documentation

## 2017-03-03 HISTORY — PX: IR KYPHO THORACIC WITH BONE BIOPSY: IMG5518

## 2017-03-03 LAB — CBC
HCT: 42.9 % (ref 36.0–46.0)
HEMOGLOBIN: 14.9 g/dL (ref 12.0–15.0)
MCH: 31.2 pg (ref 26.0–34.0)
MCHC: 34.7 g/dL (ref 30.0–36.0)
MCV: 89.9 fL (ref 78.0–100.0)
Platelets: 310 10*3/uL (ref 150–400)
RBC: 4.77 MIL/uL (ref 3.87–5.11)
RDW: 13.3 % (ref 11.5–15.5)
WBC: 9.1 10*3/uL (ref 4.0–10.5)

## 2017-03-03 LAB — BASIC METABOLIC PANEL
ANION GAP: 10 (ref 5–15)
BUN: 24 mg/dL — ABNORMAL HIGH (ref 6–20)
CO2: 28 mmol/L (ref 22–32)
Calcium: 10.3 mg/dL (ref 8.9–10.3)
Chloride: 102 mmol/L (ref 101–111)
Creatinine, Ser: 1.39 mg/dL — ABNORMAL HIGH (ref 0.44–1.00)
GFR, EST AFRICAN AMERICAN: 40 mL/min — AB (ref >60)
GFR, EST NON AFRICAN AMERICAN: 35 mL/min — AB (ref >60)
GLUCOSE: 99 mg/dL (ref 65–99)
Potassium: 3.4 mmol/L — ABNORMAL LOW (ref 3.5–5.1)
SODIUM: 140 mmol/L (ref 135–145)

## 2017-03-03 LAB — PROTIME-INR
INR: 1.08
PROTHROMBIN TIME: 14.1 s (ref 11.4–15.2)

## 2017-03-03 MED ORDER — IOPAMIDOL (ISOVUE-300) INJECTION 61%
10.0000 mL | Freq: Once | INTRAVENOUS | Status: AC | PRN
  Administered 2017-03-03: 10 mL

## 2017-03-03 MED ORDER — FENTANYL CITRATE (PF) 100 MCG/2ML IJ SOLN
INTRAMUSCULAR | Status: AC
  Filled 2017-03-03: qty 4

## 2017-03-03 MED ORDER — MIDAZOLAM HCL 2 MG/2ML IJ SOLN
INTRAMUSCULAR | Status: AC | PRN
  Administered 2017-03-03 (×2): 1 mg via INTRAVENOUS

## 2017-03-03 MED ORDER — LIDOCAINE HCL (PF) 1 % IJ SOLN
INTRAMUSCULAR | Status: AC | PRN
  Administered 2017-03-03: 30 mL

## 2017-03-03 MED ORDER — CEFAZOLIN SODIUM-DEXTROSE 2-4 GM/100ML-% IV SOLN
INTRAVENOUS | Status: AC
  Administered 2017-03-03: 2 g via INTRAVENOUS
  Filled 2017-03-03: qty 100

## 2017-03-03 MED ORDER — FENTANYL CITRATE (PF) 100 MCG/2ML IJ SOLN
INTRAMUSCULAR | Status: AC | PRN
  Administered 2017-03-03 (×2): 50 ug via INTRAVENOUS

## 2017-03-03 MED ORDER — SODIUM CHLORIDE 0.9 % IV SOLN
INTRAVENOUS | Status: DC
  Administered 2017-03-03: 12:00:00 via INTRAVENOUS

## 2017-03-03 MED ORDER — IOPAMIDOL (ISOVUE-300) INJECTION 61%
INTRAVENOUS | Status: AC
  Filled 2017-03-03: qty 50

## 2017-03-03 MED ORDER — LIDOCAINE HCL (PF) 1 % IJ SOLN
INTRAMUSCULAR | Status: AC
  Filled 2017-03-03: qty 30

## 2017-03-03 MED ORDER — MIDAZOLAM HCL 2 MG/2ML IJ SOLN
INTRAMUSCULAR | Status: AC
  Filled 2017-03-03: qty 4

## 2017-03-03 MED ORDER — CEFAZOLIN SODIUM-DEXTROSE 2-4 GM/100ML-% IV SOLN
2.0000 g | INTRAVENOUS | Status: AC
  Administered 2017-03-03: 2 g via INTRAVENOUS

## 2017-03-03 NOTE — Procedures (Signed)
Interventional Radiology Procedure Note  Procedure: T12 kyphoplasty  Complications: None  Estimated Blood Loss: None  Recommendations: - Bedrest x 3 hrs - DC home - Follow up in 2 weeks  Signed,  Criselda Peaches, MD

## 2017-03-03 NOTE — Discharge Instructions (Signed)
Moderate Conscious Sedation, Adult, Care After These instructions provide you with information about caring for yourself after your procedure. Your health care provider may also give you more specific instructions. Your treatment has been planned according to current medical practices, but problems sometimes occur. Call your health care provider if you have any problems or questions after your procedure. What can I expect after the procedure? After your procedure, it is common:  To feel sleepy for several hours.  To feel clumsy and have poor balance for several hours.  To have poor judgment for several hours.  To vomit if you eat too soon.  Follow these instructions at home: For at least 24 hours after the procedure:   Do not: ? Participate in activities where you could fall or become injured. ? Drive. ? Use heavy machinery. ? Drink alcohol. ? Take sleeping pills or medicines that cause drowsiness. ? Make important decisions or sign legal documents. ? Take care of children on your own.  Rest. Eating and drinking  Follow the diet recommended by your health care provider.  If you vomit: ? Drink water, juice, or soup when you can drink without vomiting. ? Make sure you have little or no nausea before eating solid foods. General instructions  Have a responsible adult stay with you until you are awake and alert.  Take over-the-counter and prescription medicines only as told by your health care provider.  If you smoke, do not smoke without supervision.  Keep all follow-up visits as told by your health care provider. This is important. Contact a health care provider if:  You keep feeling nauseous or you keep vomiting.  You feel light-headed.  You develop a rash.  You have a fever. Get help right away if:  You have trouble breathing. This information is not intended to replace advice given to you by your health care provider. Make sure you discuss any questions you have  with your health care provider. Document Released: 06/06/2013 Document Revised: 01/19/2016 Document Reviewed: 12/06/2015 Elsevier Interactive Patient Education  2018 [employer].    Percutaneous Vertebroplasty, Care After Percutaneous (through the skin) vertebroplasty is a procedure used to treat collapsed bones (compression fractures) of the spine. Spine (vertebral) fractures can be painful and limit movement. Percutaneous vertebroplasty stabilizes the fracture by injecting bone cement into the collapsed bone. This restores the vertebra and helps prevent further collapse.  These instructions give you information on caring for yourself after your procedure. Your doctor may also give you more specific instructions. Call your doctor if you have any problems or questions after your procedure. Follow these instructions at home:  Take medicine as told by your doctor.  Keep your wound dry and covered for 24 hours or as told by your doctor.  Ask your doctor when you can bathe or shower.  Put an ice pack on your wound. ? Put ice in a plastic bag. ? Place a towel between your skin and the bag. ? Leave the ice on for 15-20 minutes, 3-4 times a day.  Rest in your bed for 24 hours or as told by your doctor.  Return to normal activities as told by your doctor.  Ask your doctor what stretches and exercises you can do.  Do not bend or lift anything heavy as told by your doctor. Contact a doctor if:  Your wound becomes red, puffy (swollen), or tender to the touch.  You are bleeding or leaking fluid from the wound.  You are sick to your  stomach (nauseous) or throw up (vomit) for more than 24 hours after the procedure.  Your back pain does not get better.  You have a fever. Get help right away if:  You have bad back pain that comes on suddenly.  You cannot control when you pee (urinate) or poop (bowel movement).  You lose feeling (numbness) or have tingling in your legs or feet, or  they become weak.  You have sudden weakness in your arms or legs.  You have shooting pain down your legs.  You have chest pain or a hard time breathing.  You feel dizzy or pass out (faint).  Your vision changes or you cannot talk as you normally do. This information is not intended to replace advice given to you by your health care provider. Make sure you discuss any questions you have with your health care provider. Document Released: 11/10/2009 Document Revised: 01/22/2016 Document Reviewed: 04/24/2013 Elsevier Interactive Patient Education  [employer].

## 2017-03-03 NOTE — H&P (Signed)
Referring Physician(s): [place]  Supervising Physician: Jacqulynn Cadet  Patient Status:  [facility]  Chief Complaint:  Back pain,T12 fracture  Subjective: Patient familiar to IR service from recent consultation with Dr. Laurence Ferrari 02/24/17 to discuss treatment options for symptomatic T12 compression fracture. She was deemed an appropriate candidate for T12 vertebral augmentation and presents today for the procedure. Please see recent consultation for additional details of history. She currently denies fever, headache, chest pain, dyspnea, cough, abdominal pain, nausea, vomiting or abnormal bleeding. She has recently completed treatment for UTI as well. Past Medical History:  Diagnosis Date  . A-fib ([place])   . Cancer ([place])    mouth/dx 08-2010/surg only  . Dementia    Early  . High cholesterol   . Hypertension   . Osteoporosis   . Vertigo   . Vitamin B 12 deficiency    Past Surgical History:  Procedure Laterality Date  . APPENDECTOMY    . BIOPSY N/A 07/08/2015   Procedure: BIOPSY;  Surgeon: Daneil Dolin, MD;  [facility];  Service: Endoscopy;  Laterality: N/A;  possible random colon biopsies  . CARDIAC CATHETERIZATION  10/2009   normal coronary arteries  . CERVICAL DISC SURGERY    . COLONOSCOPY  05/27/2004   XTG:[id number] hemorrhoids/sigmoid diverticula  . COLONOSCOPY N/A 07/08/2015   Procedure: COLONOSCOPY;  Surgeon: Daneil Dolin, MD;  [facility];  Service: Endoscopy;  Laterality: N/A;  [id number]  . 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 MANDIBLE SURGERY    . SHOULDER SURGERY  remote      Allergies: Erythromycin base  Medications: Prior to Admission medications   Medication Sig Start Date End Date Taking? Authorizing Provider  Calcium Citrate-Vitamin D (CALCIUM + D PO) Take 600 mg by mouth daily.   Yes [provider]  Cetirizine HCl (ZYRTEC ALLERGY) 10 MG CAPS Take by mouth daily.   Yes [provider]    ciprofloxacin (CIPRO) 250 MG tablet Take 250 mg by mouth 2 (two) times daily.   Yes [provider]  docusate sodium (COLACE) 100 MG capsule Take 100 mg by mouth 2 (two) times daily.   Yes [provider]  donepezil (ARICEPT) 5 MG tablet Take 5 mg by mouth at bedtime.   Yes [provider]  hydrochlorothiazide (HYDRODIURIL) 25 MG tablet Take 12.5 mg by mouth daily.   Yes [provider]  HYDROcodone-acetaminophen (NORCO/VICODIN) 5-325 MG tablet Take 1 tablet by mouth every 6 (six) hours as needed for moderate pain.   Yes [provider]  losartan (COZAAR) 100 MG tablet Take 1 tablet (100 mg total) by mouth daily. 03/23/13  Yes Herminio Commons, MD  metoprolol tartrate (LOPRESSOR) 25 MG tablet Take 25 mg by mouth 2 (two) times daily.    Yes [provider]  potassium chloride (KLOR-CON) 10 MEQ CR tablet Take 10 mEq by mouth daily.    Yes [provider]  simvastatin (ZOCOR) 40 MG tablet Take 40 mg by mouth at bedtime.    Yes [provider]  trimethoprim (TRIMPEX) 100 MG tablet Take 1 tablet by mouth daily. 05/14/15  Yes [provider]  vitamin B-12 (CYANOCOBALAMIN) 1000 MCG tablet Take 1,000 mcg by mouth daily.   Yes [provider]  warfarin (COUMADIN) 5 MG tablet Take 1 tablet daily or as directed Patient taking differently: Take 5 mg by mouth every evening. Take 1 tablet one day and 1/2 tablet the next  day, and repeat. 09/10/14   Herminio Commons, MD     Vital Signs: BP 104/85 (BP Location: Right Arm)   Pulse 70   Temp 98.5 F (36.9 C) (Oral)   Resp 16   SpO2 100%   Physical Exam awake, alert. Chest clear to auscultation bilaterally. Heart with regular rate and rhythm. Abdomen soft, positive bowel sounds, nontender. No lower extremity edema. Moderate paravertebral tenderness T12 region.  Imaging: No results found.  Labs:  CBC:  Recent Labs  03/03/17 1148  WBC 9.1  HGB 14.9  HCT  42.9  PLT 310    COAGS:  Recent Labs  11/24/16 1047 12/27/16 1105 02/09/17 0856 03/03/17 1148  INR 2.2 2.3 2.8 1.08    BMP: No results for input(s): NA, K, CL, CO2, GLUCOSE, BUN, CALCIUM, CREATININE, GFRNONAA, GFRAA in the last 8760 hours.  Invalid input(s): CMP  LIVER FUNCTION TESTS: No results for input(s): BILITOT, AST, ALT, ALKPHOS, PROT, ALBUMIN in the last 8760 hours.  Assessment and Plan: Patient with history of osteoporosis, mild dementia, paroxysmal atrial fibrillation on Coumadin, recent fall at home with subsequent symptomatic T12 compression fracture; seen in  consultation by Dr. Laurence Ferrari on 02/24/17 and deemed an appropriate candidate for vertebral body augmentation; she presents today for the procedure.Risks and benefits discussed with the patient/family including, but not limited to education regarding the natural healing process of compression fractures without intervention, bleeding, infection, cement migration which may cause spinal cord damage, paralysis, pulmonary embolism or even death.All of the patient's questions were answered, patient is agreeable to proceed.Consent signed and in chart.      Electronically Signed: D. Rowe Robert, PA-C 03/03/2017, 12:29 PM   I spent a total of 20 minutes  at the the patient's bedside AND on the patient's hospital floor or unit, greater than 50% of which was counseling/coordinating care for T12 vertebral body augmentation

## 2017-03-14 ENCOUNTER — Other Ambulatory Visit (HOSPITAL_COMMUNITY): Payer: Self-pay | Admitting: Interventional Radiology

## 2017-03-14 DIAGNOSIS — M546 Pain in thoracic spine: Secondary | ICD-10-CM

## 2017-03-22 ENCOUNTER — Ambulatory Visit
Admission: RE | Admit: 2017-03-22 | Discharge: 2017-03-22 | Disposition: A | Discharge status: Home / Self Care | Payer: Medicare PPO | Source: Ambulatory Visit | Attending: Interventional Radiology | Admitting: Interventional Radiology

## 2017-03-22 DIAGNOSIS — M546 Pain in thoracic spine: Secondary | ICD-10-CM

## 2017-03-22 HISTORY — PX: IR RADIOLOGIST EVAL & MGMT: IMG5224

## 2017-03-22 NOTE — Progress Notes (Signed)
Chief Complaint: Patient was seen in consultation today for  Chief Complaint  Patient presents with  . Follow-up    3 wk follow up T/12 Kyphoplasty   at the request of [place]  Referring Physician(s): Lockie Bothun  History of Present Illness: Jaclyn Schultz is a 83 y.o. female mild dementia who was in her usual state of relatively good health when she fell while bending over to pluck weeds out of her garden. She developed acute onset of thoracic spine pain. Workup revealed an acute T12 compression fracture.  She underwent T12 KP on 03/03/17 which was technically successful.  Her daughter reports that she is doing much better - she is able to transfer from sitting to standing and the reverse on her own when she previously required maximal assist.   However, she remains minimally active.  She stays in her lift chair for the majority of the day. She has not regained her prior level of activity.   No other new or acute complaints.   Past Medical History:  Diagnosis Date  . A-fib ([place])   . Cancer ([place])    mouth/dx 08-2010/surg only  . Dementia    Early  . High cholesterol   . Hypertension   . Osteoporosis   . Vertigo   . Vitamin B 12 deficiency     Past Surgical History:  Procedure Laterality Date  . APPENDECTOMY    . BIOPSY N/A 07/08/2015   Procedure: BIOPSY;  Surgeon: Daneil Dolin, MD;  Location: [facility];  Service: Endoscopy;  Laterality: N/A;  possible random colon biopsies  . CARDIAC CATHETERIZATION  10/2009   normal coronary arteries  . CERVICAL DISC SURGERY    . COLONOSCOPY  05/27/2004   DQQ:[id number] hemorrhoids/sigmoid diverticula  . COLONOSCOPY N/A 07/08/2015   Procedure: COLONOSCOPY;  Surgeon: Daneil Dolin, MD;  Location: [facility];  Service: Endoscopy;  Laterality: N/A;  1194  . IR KYPHO THORACIC WITH BONE BIOPSY  03/03/2017  . 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 MANDIBLE SURGERY    . SHOULDER SURGERY  remote     Allergies: Erythromycin base  Medications: Prior to Admission medications   Medication Sig Start Date End Date Taking? Authorizing Provider  Calcium Citrate-Vitamin D (CALCIUM + D PO) Take 600 mg by mouth daily.    [provider]  Cetirizine HCl (ZYRTEC ALLERGY) 10 MG CAPS Take by mouth daily.    [provider]  ciprofloxacin (CIPRO) 250 MG tablet Take 250 mg by mouth 2 (two) times daily.    [provider]  docusate sodium (COLACE) 100 MG capsule Take 100 mg by mouth 2 (two) times daily.    [provider]  donepezil (ARICEPT) 5 MG tablet Take 5 mg by mouth at bedtime.    [provider]  hydrochlorothiazide (HYDRODIURIL) 25 MG tablet Take 12.5 mg by mouth daily.    [provider]  HYDROcodone-acetaminophen (NORCO/VICODIN) 5-325 MG tablet Take 1 tablet by mouth every 6 (six) hours as needed for moderate pain.    [provider]  losartan (COZAAR) 100 MG tablet Take 1 tablet (100 mg total) by mouth daily. 03/23/13   Herminio Commons, MD  metoprolol tartrate (LOPRESSOR) 25 MG tablet Take 25 mg by mouth 2 (two) times daily.     [provider]  potassium chloride (KLOR-CON) 10 MEQ CR tablet Take 10 mEq by mouth daily.     [provider]  simvastatin (  ZOCOR) 40 MG tablet Take 40 mg by mouth at bedtime.     [provider]  trimethoprim (TRIMPEX) 100 MG tablet Take 1 tablet by mouth daily. 05/14/15   [provider]  vitamin B-12 (CYANOCOBALAMIN) 1000 MCG tablet Take 1,000 mcg by mouth daily.    [provider]  warfarin (COUMADIN) 5 MG tablet Take 1 tablet daily or as directed Patient taking differently: Take 5 mg by mouth every evening. Take 1 tablet one day and 1/2 tablet the next day, and repeat. 09/10/14   Herminio Commons, MD     Family History  Problem Relation Age of Onset  . Colon cancer Sister        age 88    Social History   Social History  . Marital  status: Widowed    Spouse name: N/A  . Number of children: 5  . Years of education: N/A   Social History Main Topics  . Smoking status: Never Smoker  . Smokeless tobacco: Former Systems developer    Types: [place] date: 02/27/2013     Comment: Never smoked  . Alcohol use No  . Drug use: No  . Sexual activity: No   Other Topics Concern  . Not on file   Social History Narrative  . No narrative on file    Review of Systems: A 12 point ROS discussed and pertinent positives are indicated in the HPI above.  All other systems are negative.  Review of Systems  Vital Signs: BP (!) 107/55   Pulse 62   Temp 98.2 F (36.8 C) (Oral)   Resp 15   Ht 5\' 4"  (1.626 m)   SpO2 100%   Physical Exam  Constitutional: She appears well-developed and well-nourished. No distress.  HENT:  Head: Normocephalic and atraumatic.  Eyes: No scleral icterus.  Cardiovascular: Normal rate.   Pulmonary/Chest: Effort normal.  Abdominal: Soft. She exhibits no distension. There is no tenderness.  Musculoskeletal:       Back:  +TTP in the upper lumbar paraspinal muscles  Nursing note and vitals reviewed.    Imaging: Dg Thoracic Spine W/swimmers  Result Date: 02/22/2017 CLINICAL DATA:  Back pain.  Injury. EXAM: THORACIC SPINE - 3 VIEWS COMPARISON:  Lumbar spine 02/14/2017. Chest x-ray 12/16/2014 . CT 09/26/2014. FINDINGS: Thoracic spine scoliosis and degenerative change. A new compression fracture T12 noted. Vertebrae are numbered with the lowest segmented appearing lumbar shaped vertebra as L5. This is new from prior studies. IMPRESSION: New moderate compression fracture T12 . Electronically Signed   By: Marcello Moores  Register   On: 02/22/2017 12:43   Dg Lumbar Spine Complete  Result Date: 02/22/2017 CLINICAL DATA:  Fall.  Pain. EXAM: LUMBAR SPINE - COMPLETE 4+ VIEW COMPARISON:  02/14/2017. FINDINGS: Vertebra numbered with the lowest lumbar shaped segmented vertebra as L5 . New moderate T12 compression fracture  noted. Diffuse degenerative change present. Radiopacity noted left upper quadrant most likely pill fragment. Gallstones cannot be excluded. IMPRESSION: New moderate T12 compression fracture . Electronically Signed   By: Marcello Moores  Register   On: 02/22/2017 12:47   Mr Thoracic Spine Wo Contrast  Result Date: 02/23/2017 CLINICAL DATA:  Nine day history of back pain after falling. T12 compression fracture noted on radiographs from yesterday. EXAM: MRI THORACIC SPINE WITHOUT CONTRAST TECHNIQUE: Multiplanar, multisequence MR imaging of the thoracic spine was performed. No intravenous contrast was administered. COMPARISON:  Radiographs 02/22/2017 and chest CT 09/26/2014 FINDINGS: Alignment:  Normal Vertebrae: There is a acute  compression fracture of the T12 vertebral body, mainly involving the inferior endplate. Some preserved T1 marrow signal. Approximately 25% compression. Minimal retropulsion but no canal compromise. The other thoracic vertebral bodies are maintained. There are surgical changes noted in the cervical spine with anterior and interbody fusions at C4-5 and interbody fusions at C5-6 and C6-7. Cord: Normal wall cord signal intensity. No cord lesions or syrinx. Paraspinal and other soft tissues: No significant findings. Disc levels: Right-sided disc osteophyte complex at C7-T1. There is mild mass effect on the right side of the thecal sac and mild right foraminal stenosis possibly irritating the right C8 nerve root. Small central disc protrusions noted at T1-2, T2-3, T3-4 and T4-5. Shallow broad-based disc protrusion and bulging disc at T5-6. Small focal central disc protrusion at T6-7. Shallow broad-based right paracentral disc protrusion at T7-8. Mild mass effect on the thecal sac. Tiny central disc protrusion at T8-9 Mild retropulsion noted at T12. There is slight flattening of the disc at T12-L1 but no significant spinal or foraminal stenosis. Mild lateral recess encroachment. IMPRESSION: 1. Acute  compression fracture of T12 with minimal retropulsion but no canal compromise. Mild flattening of the ventral thecal sac and mild lateral recess encroachment. 2. Shallow multilevel disc protrusions as described above. T7-8 appears to be the most significant level. 3. Normal MR appearance of the thoracic spinal cord. 4. Postsurgical changes involving the cervical spine. Electronically Signed   By: Marijo Sanes M.D.   On: 02/23/2017 11:24   Ir Kypho Thoracic With Bone Biopsy  Result Date: 03/03/2017 CLINICAL DATA:  83 year old female with highly symptomatic T12 insufficiency fracture. EXAM: FLUOROSCOPIC GUIDED KYPHOPLASTY OF THE  VERTEBRAL BODY COMPARISON:  Thoracic spine MRI 02/23/2017 MEDICATIONS: As antibiotic prophylaxis, 2 g Ancef was ordered pre-procedure and administered intravenously within 1 hour of incision. ANESTHESIA/SEDATION: Moderate (conscious) sedation was employed during this procedure. A total of Versed 2 mg and Fentanyl 100 mcg was administered intravenously. Moderate Sedation Time: 35 minutes. The patient's level of consciousness and vital signs were monitored continuously by radiology nursing throughout the procedure under my direct supervision. FLUOROSCOPY TIME:  5 minutes 36 seconds (544 mGy) COMPLICATIONS: None immediate. TECHNIQUE: The procedure, risks (including but not limited to bleeding, infection, organ damage), benefits, and alternatives were explained to the patient. Questions regarding the procedure were encouraged and answered. The patient understands and consents to the procedure. The patient was placed prone on the fluoroscopic table. The skin overlying the upper thoracic region was then prepped and draped in the usual sterile fashion. Maximal barrier sterile technique was utilized including caps, mask, sterile gowns, sterile gloves, sterile drape, hand hygiene and skin antiseptic. Intravenous Fentanyl and Versed were administered as conscious sedation during continuous  cardiorespiratory monitoring by the radiology RN. The left pedicle at T12 was then infiltrated with 1% lidocaine followed by the advancement of a Kyphon trocar needle through the left pedicle into the posterior one-third of the vertebral body. Subsequently, the osteo drill was advanced to the anterior third of the vertebral body. The osteo drill was retracted. Through the working cannula, a Kyphon inflatable bone tamp 15 x 2 was advanced and positioned with the distal marker approximately 5 mm from the anterior aspect of the cortex. Appropriate positioning was confirmed on the AP projection. At this time, the balloon was expanded using contrast via a Kyphon inflation syringe device via micro tubing. Inflations were continued until there was near apposition with the superior end plate. At this time, methylmethacrylate mixture was reconstituted in the  Kyphon bone mixing device system. This was then loaded into the delivery mechanism, attached to Kyphon filling device. The balloons were deflated and removed followed by the instillation of methylmethacrylate mixture with excellent filling in the AP and lateral projections. No extravasation was noted in the disk spaces or posteriorly into the spinal canal. No epidural venous contamination was seen. The working cannulae and the bone filler were then retrieved and removed. Hemostasis was achieved with manual compression. The patient tolerated the procedure well without immediate postprocedural complication. IMPRESSION: 1. Technically successful T12 vertebral body augmentation using balloon kyphoplasty. 2. Per CMS PQRS reporting requirements (PQRS Measure 24): Given the patient's age of greater than 92 and the fracture site (hip, distal radius, or spine), the patient should be tested for osteoporosis using DXA, and the appropriate treatment considered based on the DXA results. Signed, Criselda Peaches, MD [facility]  Radiology Electronically Signed   By: Jacqulynn Cadet M.D.   On: 03/03/2017 15:20   Dg Hip Unilat With Pelvis 2-3 Views Left  Result Date: 02/22/2017 CLINICAL DATA:  Fall.  Pain. EXAM: DG HIP (WITH OR WITHOUT PELVIS) 2-3V LEFT COMPARISON:  Lumbar spine 02/14/2017 . FINDINGS: Degenerative changes lumbar spine and both hips. No acute bony or joint abnormality identified. No evidence of hip fracture or dislocation. IMPRESSION: No acute abnormality. Electronically Signed   By: Marcello Moores  Register   On: 02/22/2017 12:48    Labs:  CBC:  Recent Labs  03/03/17 1148  WBC 9.1  HGB 14.9  HCT 42.9  PLT 310    COAGS:  Recent Labs  11/24/16 1047 12/27/16 1105 02/09/17 0856 03/03/17 1148  INR 2.2 2.3 2.8 1.08    BMP:  Recent Labs  03/03/17 1148  NA 140  K 3.4*  CL 102  CO2 28  GLUCOSE 99  BUN 24*  CALCIUM 10.3  CREATININE 1.39*  GFRNONAA 35*  GFRAA 40*    LIVER FUNCTION TESTS: No results for input(s): BILITOT, AST, ALT, ALKPHOS, PROT, ALBUMIN in the last 8760 hours.  TUMOR MARKERS: No results for input(s): AFPTM, CEA, CA199, CHROMGRNA in the last 8760 hours.  Assessment and Plan:  Significant improvement in thoracic spine pain following T12 kyphoplasty.  She does have some residual soreness and her activity level remains less than prior to her fall. She would like to be more active.    On exam, her pain seems to be more muscular in nature.  We'll add an anti-inflammatory to her pain regimen.   1. Naproxen 500 mg BID x 5 days then PRN 2. Office to call next week for status check    Electronically Signed: Jacqulynn Cadet 03/22/2017, 5:39 PM   I spent a total of  15 Minutes in face to face in clinical consultation, greater than 50% of which was counseling/coordinating care for T12 compression fracture.

## 2017-03-23 ENCOUNTER — Ambulatory Visit (INDEPENDENT_AMBULATORY_CARE_PROVIDER_SITE_OTHER): Payer: Medicare PPO | Admitting: *Deleted

## 2017-03-23 DIAGNOSIS — Z7901 Long term (current) use of anticoagulants: Secondary | ICD-10-CM | POA: Diagnosis not present

## 2017-03-23 DIAGNOSIS — Z5181 Encounter for therapeutic drug level monitoring: Secondary | ICD-10-CM | POA: Diagnosis not present

## 2017-03-23 LAB — POCT INR: INR: 2.6

## 2017-03-29 ENCOUNTER — Telehealth: Payer: Self-pay | Admitting: Radiology

## 2017-03-29 NOTE — Telephone Encounter (Signed)
Spoke w/ patient's daughter, Lorna Few.  States that Naprosyn is helping with managing her mother's pain.  Patient is getting up more and moving around more   Instructed to call back as needed.  Vinetta Brach Riki Rusk, [place] 03/29/2017 2:38 PM

## 2017-05-04 ENCOUNTER — Encounter: Payer: Self-pay | Admitting: *Deleted

## 2017-05-16 ENCOUNTER — Ambulatory Visit (INDEPENDENT_AMBULATORY_CARE_PROVIDER_SITE_OTHER): Payer: Medicare PPO | Admitting: *Deleted

## 2017-05-16 DIAGNOSIS — I482 Chronic atrial fibrillation, unspecified: Secondary | ICD-10-CM

## 2017-05-16 DIAGNOSIS — Z7901 Long term (current) use of anticoagulants: Secondary | ICD-10-CM

## 2017-05-16 DIAGNOSIS — Z5181 Encounter for therapeutic drug level monitoring: Secondary | ICD-10-CM | POA: Diagnosis not present

## 2017-05-16 LAB — POCT INR: INR: 3.2

## 2017-06-01 ENCOUNTER — Other Ambulatory Visit (HOSPITAL_COMMUNITY): Payer: Self-pay | Admitting: Family Medicine

## 2017-06-01 DIAGNOSIS — Z780 Asymptomatic menopausal state: Secondary | ICD-10-CM

## 2017-06-10 ENCOUNTER — Other Ambulatory Visit (HOSPITAL_COMMUNITY): Payer: Medicare PPO

## 2017-06-13 ENCOUNTER — Encounter: Payer: Self-pay | Admitting: Interventional Radiology

## 2017-06-13 ENCOUNTER — Ambulatory Visit (INDEPENDENT_AMBULATORY_CARE_PROVIDER_SITE_OTHER): Payer: Medicare PPO | Admitting: *Deleted

## 2017-06-13 ENCOUNTER — Ambulatory Visit (HOSPITAL_COMMUNITY)
Admission: RE | Admit: 2017-06-13 | Discharge: 2017-06-13 | Disposition: A | Discharge status: Home / Self Care | Payer: Medicare PPO | Source: Ambulatory Visit | Attending: Family Medicine | Admitting: Family Medicine

## 2017-06-13 DIAGNOSIS — Z780 Asymptomatic menopausal state: Secondary | ICD-10-CM

## 2017-06-13 DIAGNOSIS — Z5181 Encounter for therapeutic drug level monitoring: Secondary | ICD-10-CM

## 2017-06-13 DIAGNOSIS — I482 Chronic atrial fibrillation, unspecified: Secondary | ICD-10-CM

## 2017-06-13 DIAGNOSIS — Z7901 Long term (current) use of anticoagulants: Secondary | ICD-10-CM | POA: Diagnosis not present

## 2017-06-13 DIAGNOSIS — M8589 Other specified disorders of bone density and structure, multiple sites: Secondary | ICD-10-CM | POA: Diagnosis not present

## 2017-06-13 LAB — POCT INR: INR: 3.5

## 2017-07-07 ENCOUNTER — Encounter: Payer: Self-pay | Admitting: Interventional Radiology

## 2017-07-08 ENCOUNTER — Ambulatory Visit (INDEPENDENT_AMBULATORY_CARE_PROVIDER_SITE_OTHER): Payer: Medicare PPO | Admitting: *Deleted

## 2017-07-08 DIAGNOSIS — Z5181 Encounter for therapeutic drug level monitoring: Secondary | ICD-10-CM

## 2017-07-08 DIAGNOSIS — Z7901 Long term (current) use of anticoagulants: Secondary | ICD-10-CM | POA: Diagnosis not present

## 2017-07-08 LAB — POCT INR: INR: 2.2

## 2017-07-08 NOTE — Progress Notes (Signed)
Continue coumadin 1/2 tablet daily except 1 tablet on Mondays and Fridays Recheck in 4 weeks 

## 2017-07-18 ENCOUNTER — Telehealth: Payer: Self-pay | Admitting: *Deleted

## 2017-07-18 NOTE — Telephone Encounter (Signed)
Pt lost her dosing instructions, please call Sheri @ [phone]

## 2017-07-18 NOTE — Telephone Encounter (Signed)
LMOM with pts instructions on how she should be taking coumadin.  To call back if any other questions.

## 2017-08-03 ENCOUNTER — Ambulatory Visit (INDEPENDENT_AMBULATORY_CARE_PROVIDER_SITE_OTHER): Payer: Medicare PPO | Admitting: *Deleted

## 2017-08-03 DIAGNOSIS — Z7901 Long term (current) use of anticoagulants: Secondary | ICD-10-CM

## 2017-08-03 DIAGNOSIS — Z5181 Encounter for therapeutic drug level monitoring: Secondary | ICD-10-CM

## 2017-08-03 LAB — POCT INR: INR: 1.8

## 2017-08-26 ENCOUNTER — Encounter: Payer: Self-pay | Admitting: *Deleted

## 2017-09-07 ENCOUNTER — Ambulatory Visit (INDEPENDENT_AMBULATORY_CARE_PROVIDER_SITE_OTHER): Payer: Medicare PPO | Admitting: *Deleted

## 2017-09-07 DIAGNOSIS — I482 Chronic atrial fibrillation, unspecified: Secondary | ICD-10-CM

## 2017-09-07 DIAGNOSIS — Z7901 Long term (current) use of anticoagulants: Secondary | ICD-10-CM | POA: Diagnosis not present

## 2017-09-07 DIAGNOSIS — Z5181 Encounter for therapeutic drug level monitoring: Secondary | ICD-10-CM | POA: Diagnosis not present

## 2017-09-07 LAB — POCT INR: INR: 1.9

## 2017-09-07 NOTE — Patient Instructions (Signed)
Increase coumadin to 1/2 tablet daily except 1 tablet on Mondays, Wednesdays and Fridays Recheck in 4 weeks

## 2017-10-05 ENCOUNTER — Ambulatory Visit (INDEPENDENT_AMBULATORY_CARE_PROVIDER_SITE_OTHER): Payer: Medicare PPO | Admitting: *Deleted

## 2017-10-05 DIAGNOSIS — Z7901 Long term (current) use of anticoagulants: Secondary | ICD-10-CM

## 2017-10-05 DIAGNOSIS — Z5181 Encounter for therapeutic drug level monitoring: Secondary | ICD-10-CM | POA: Diagnosis not present

## 2017-10-05 LAB — POCT INR: INR: 1.7

## 2017-10-05 NOTE — Patient Instructions (Signed)
Take coumadin 1 1/2 tablets tonight, 1 tablet tomorrow night then resume 1/2 tablet daily except 1 tablet on Mondays, Wednesdays and Fridays Recheck in 3 weeks 

## 2017-10-13 ENCOUNTER — Other Ambulatory Visit: Payer: Self-pay

## 2017-10-13 ENCOUNTER — Encounter (HOSPITAL_COMMUNITY): Payer: Self-pay | Admitting: Emergency Medicine

## 2017-10-13 ENCOUNTER — Emergency Department (HOSPITAL_COMMUNITY): Payer: Medicare PPO

## 2017-10-13 ENCOUNTER — Emergency Department (HOSPITAL_COMMUNITY)
Admission: EM | Admit: 2017-10-13 | Discharge: 2017-10-13 | Disposition: A | Discharge status: Home / Self Care | Payer: Medicare PPO | Attending: Emergency Medicine | Admitting: Emergency Medicine

## 2017-10-13 DIAGNOSIS — Z79899 Other long term (current) drug therapy: Secondary | ICD-10-CM | POA: Insufficient documentation

## 2017-10-13 DIAGNOSIS — I10 Essential (primary) hypertension: Secondary | ICD-10-CM | POA: Insufficient documentation

## 2017-10-13 DIAGNOSIS — Z7901 Long term (current) use of anticoagulants: Secondary | ICD-10-CM | POA: Diagnosis not present

## 2017-10-13 DIAGNOSIS — R1030 Lower abdominal pain, unspecified: Secondary | ICD-10-CM | POA: Diagnosis present

## 2017-10-13 DIAGNOSIS — N3091 Cystitis, unspecified with hematuria: Secondary | ICD-10-CM | POA: Diagnosis not present

## 2017-10-13 DIAGNOSIS — N3001 Acute cystitis with hematuria: Secondary | ICD-10-CM

## 2017-10-13 LAB — URINALYSIS, ROUTINE W REFLEX MICROSCOPIC
Bilirubin Urine: NEGATIVE
GLUCOSE, UA: NEGATIVE mg/dL
KETONES UR: NEGATIVE mg/dL
NITRITE: POSITIVE — AB
PH: 7 (ref 5.0–8.0)
Protein, ur: 30 mg/dL — AB
Specific Gravity, Urine: 1.016 (ref 1.005–1.030)

## 2017-10-13 LAB — CBC
HCT: 46 % (ref 36.0–46.0)
HEMOGLOBIN: 15.1 g/dL — AB (ref 12.0–15.0)
MCH: 31.9 pg (ref 26.0–34.0)
MCHC: 32.8 g/dL (ref 30.0–36.0)
MCV: 97 fL (ref 78.0–100.0)
Platelets: 215 10*3/uL (ref 150–400)
RBC: 4.74 MIL/uL (ref 3.87–5.11)
RDW: 13.6 % (ref 11.5–15.5)
WBC: 7.7 10*3/uL (ref 4.0–10.5)

## 2017-10-13 LAB — COMPREHENSIVE METABOLIC PANEL
ALK PHOS: 70 U/L (ref 38–126)
ALT: 22 U/L (ref 14–54)
ANION GAP: 12 (ref 5–15)
AST: 30 U/L (ref 15–41)
Albumin: 4 g/dL (ref 3.5–5.0)
BILIRUBIN TOTAL: 0.3 mg/dL (ref 0.3–1.2)
BUN: 25 mg/dL — ABNORMAL HIGH (ref 6–20)
CALCIUM: 11.4 mg/dL — AB (ref 8.9–10.3)
CO2: 26 mmol/L (ref 22–32)
Chloride: 107 mmol/L (ref 101–111)
Creatinine, Ser: 1.85 mg/dL — ABNORMAL HIGH (ref 0.44–1.00)
GFR calc Af Amer: 28 mL/min — ABNORMAL LOW (ref >60)
GFR calc non Af Amer: 24 mL/min — ABNORMAL LOW (ref >60)
Glucose, Bld: 120 mg/dL — ABNORMAL HIGH (ref 65–99)
Potassium: 4.4 mmol/L (ref 3.5–5.1)
Sodium: 145 mmol/L (ref 135–145)
Total Protein: 7.4 g/dL (ref 6.5–8.1)

## 2017-10-13 LAB — PROTIME-INR
INR: 2.36
PROTHROMBIN TIME: 25.6 s — AB (ref 11.4–15.2)

## 2017-10-13 LAB — POC OCCULT BLOOD, ED
Fecal Occult Bld: POSITIVE — AB
Fecal Occult Bld: POSITIVE — AB

## 2017-10-13 LAB — LIPASE, BLOOD: Lipase: 35 U/L (ref 11–51)

## 2017-10-13 MED ORDER — SODIUM CHLORIDE 0.9 % IV SOLN
1.0000 g | Freq: Once | INTRAVENOUS | Status: AC
  Administered 2017-10-13: 1 g via INTRAVENOUS
  Filled 2017-10-13: qty 10

## 2017-10-13 MED ORDER — SODIUM CHLORIDE 0.9 % IV BOLUS (SEPSIS)
1000.0000 mL | Freq: Once | INTRAVENOUS | Status: AC
  Administered 2017-10-13: 1000 mL via INTRAVENOUS

## 2017-10-13 MED ORDER — CEPHALEXIN 500 MG PO CAPS: 500.0000 mg | ORAL_CAPSULE | Freq: Four times a day (QID) | ORAL | 0 refills | Status: DC

## 2017-10-13 NOTE — Discharge Instructions (Signed)
Drink plenty of fluids and follow-up with your family doctor next week for recheck °

## 2017-10-13 NOTE — ED Notes (Signed)
Pt has visible skin tears to the buttock area

## 2017-10-13 NOTE — ED Triage Notes (Addendum)
PT c/o abdominal distention/pain and constipation x2 days unrelieved by Exlax and stool softeners. PT denies any n/v or urinary symptoms.

## 2017-10-13 NOTE — ED Notes (Signed)
Pt to CT

## 2017-10-14 NOTE — ED Provider Notes (Signed)
[facility] EMERGENCY DEPARTMENT Provider Note   CSN: [id number] Arrival date & time: 10/13/17  1640     History   Chief Complaint Chief Complaint  Patient presents with  . Abdominal Pain    HPI Jaclyn Schultz is a 84 y.o. female.  Patient complains of some lower abdominal pain and constipation.   The history is provided by the patient.  Abdominal Pain   This is a new problem. The current episode started yesterday. The problem occurs constantly. The problem has not changed since onset.The pain is associated with an unknown factor. The pain is located in the suprapubic region. The quality of the pain is aching. Pertinent negatives include diarrhea, frequency, hematuria and headaches.    Past Medical History:  Diagnosis Date  . A-fib ([place])   . Cancer ([place])    mouth/dx 08-2010/surg only  . Dementia    Early  . High cholesterol   . Hypertension   . Osteoporosis   . Vertigo   . Vitamin B 12 deficiency     Patient Active Problem List   Diagnosis Date Noted  . 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 Esophageal abnormality 02/03/2016  . Alzheimer's dementia without behavioral disturbance 02/03/2016  . Primary squamous cell carcinoma of head and neck ([place]) 01/30/2016  . Diarrhea 06/30/2015  . FH: colon cancer 06/30/2015  . Foot fracture, left 10/09/2013  . Encounter for therapeutic drug monitoring 09/26/2013  . Metatarsal fracture 08/14/2013  . Bronchitis, acute 07/12/2013  . SOB (shortness of breath) 07/12/2013  . 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 Chronic periodontitis, unspecified 03/22/2013  . Allergic rhinitis due to pollen 03/22/2013  . 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 Malignant neoplasm of head, face, and neck ([place]) 03/22/2013  . Other B-complex deficiencies 03/22/2013  . Mixed hyperlipidemia 03/22/2013  . Essential hypertension, benign 03/22/2013  .  Reflux esophagitis 03/22/2013  . Senile osteoporosis 03/22/2013  . Atrial fibrillation, chronic ([place]) 11/05/2012  . Long term (current) use of anticoagulants 11/05/2012    Past Surgical History:  Procedure Laterality Date  . APPENDECTOMY    . BIOPSY N/A 07/08/2015   Procedure: BIOPSY;  Surgeon: Daneil Dolin, MD;  Location: [facility] ENDO SUITE;  Service: Endoscopy;  Laterality: N/A;  possible random colon biopsies  . CARDIAC CATHETERIZATION  10/2009   normal coronary arteries  . CERVICAL DISC SURGERY    . COLONOSCOPY  05/27/2004   [id number] hemorrhoids/sigmoid diverticula  . COLONOSCOPY N/A 07/08/2015   Procedure: COLONOSCOPY;  Surgeon: Daneil Dolin, MD;  Location: [facility] ENDO SUITE;  Service: Endoscopy;  Laterality: N/A;  0102  . IR KYPHO THORACIC WITH BONE BIOPSY  03/03/2017  . IR RADIOLOGIST EVAL & MGMT  03/22/2017  . IR RADIOLOGIST EVAL & MGMT  02/24/2017  . 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 MANDIBLE SURGERY    . SHOULDER SURGERY  remote    OB History    Gravida Para Term Preterm AB Living             4   SAB TAB Ectopic Multiple Live Births                   Home Medications    Prior to Admission medications   Medication Sig Start Date End Date Taking? Authorizing Provider  Calcium Citrate-Vitamin D (CALCIUM + D PO) Take 600 mg by mouth every morning.  Yes [provider]  cetirizine (ZYRTEC ALLERGY) 10 MG tablet Take 10 mg by mouth every morning.    Yes [provider]  donepezil (ARICEPT) 10 MG tablet Take 10 mg by mouth at bedtime. 09/06/17  Yes [provider]  hydrochlorothiazide (HYDRODIURIL) 25 MG tablet Take 12.5 mg by mouth daily.   Yes [provider]  HYDROcodone-acetaminophen (NORCO/VICODIN) 5-325 MG tablet Take 1 tablet by mouth daily.    Yes [provider]  losartan (COZAAR) 100 MG tablet Take 1 tablet (100 mg total) by mouth daily. 03/23/13  Yes Herminio Commons, MD  memantine (NAMENDA) 5 MG tablet  Take 5 mg by mouth 2 (two) times daily. 09/06/17  Yes [provider]  metoprolol tartrate (LOPRESSOR) 25 MG tablet Take 25 mg by mouth 2 (two) times daily.    Yes [provider]  potassium chloride (KLOR-CON) 10 MEQ CR tablet Take 10 mEq by mouth daily.    Yes [provider]  senna (SENOKOT) 8.6 MG tablet Take 1 tablet by mouth 3 (three) times a week.   Yes [provider]  simvastatin (ZOCOR) 40 MG tablet Take 40 mg by mouth at bedtime.    Yes [provider]  trimethoprim (TRIMPEX) 100 MG tablet Take 1 tablet by mouth daily. 05/14/15  Yes [provider]  vitamin B-12 (CYANOCOBALAMIN) 1000 MCG tablet Take 1,000 mcg by mouth daily.   Yes [provider]  warfarin (COUMADIN) 5 MG tablet Take 1 tablet daily or as directed Patient taking differently: Take 2.5-5 mg by mouth See admin instructions. 5mg  on MWF. Take 2.5mg  on all other days 09/10/14  Yes Herminio Commons, MD  cephALEXin (KEFLEX) 500 MG capsule Take 1 capsule (500 mg total) by mouth 4 (four) times daily. 10/13/17   Milton Ferguson, MD    Family History Family History  Problem Relation Age of Onset  . Colon cancer Sister        age 75    Social History Social History   Tobacco Use  . Smoking status: Never Smoker  . Smokeless tobacco: Former Systems developer    Types: Chew  . Tobacco comment: Never smoked  Substance Use Topics  . Alcohol use: No    Alcohol/week: 0.0 oz  . Drug use: No     Allergies   Erythromycin base   Review of Systems Review of Systems  Constitutional: Negative for appetite change and fatigue.  HENT: Negative for congestion, ear discharge and sinus pressure.   Eyes: Negative for discharge.  Respiratory: Negative for cough.   Cardiovascular: Negative for chest pain.  Gastrointestinal: Positive for abdominal pain. Negative for diarrhea.  Genitourinary: Negative for frequency and hematuria.  Musculoskeletal: Negative for back pain.  Skin:  Negative for rash.  Neurological: Negative for seizures and headaches.  Psychiatric/Behavioral: Negative for hallucinations.     Physical Exam Updated Vital Signs BP (!) 162/64 (BP Location: Right Arm)   Pulse 80   Temp 97.9 F (36.6 C) (Oral)   Resp 16   Ht 5\' 4"  (1.626 m)   Wt 73.9 kg (163 lb)   SpO2 99%   BMI 27.98 kg/m   Physical Exam  Constitutional: She is oriented to person, place, and time. She appears well-developed.  HENT:  Head: Normocephalic.  Eyes: Conjunctivae and EOM are normal. No scleral icterus.  Neck: Neck supple. No thyromegaly present.  Cardiovascular: Normal rate and regular rhythm. Exam reveals no gallop and no friction rub.  No murmur heard. Pulmonary/Chest:  No stridor. She has no wheezes. She has no rales. She exhibits no tenderness.  Abdominal: She exhibits no distension. There is tenderness. There is no rebound.  Musculoskeletal: Normal range of motion. She exhibits no edema.  Lymphadenopathy:    She has no cervical adenopathy.  Neurological: She is oriented to person, place, and time. She exhibits normal muscle tone. Coordination normal.  Skin: No rash noted. No erythema.  Psychiatric: She has a normal mood and affect. Her behavior is normal.     ED Treatments / Results  Labs (all labs ordered are listed, but only abnormal results are displayed) Labs Reviewed  COMPREHENSIVE METABOLIC PANEL - Abnormal; Notable for the following components:      Result Value   Glucose, Bld 120 (*)    BUN 25 (*)    Creatinine, Ser 1.85 (*)    Calcium 11.4 (*)    GFR calc non Af Amer 24 (*)    GFR calc Af Amer 28 (*)    All other components within normal limits  CBC - Abnormal; Notable for the following components:   Hemoglobin 15.1 (*)    All other components within normal limits  URINALYSIS, ROUTINE W REFLEX MICROSCOPIC - Abnormal; Notable for the following components:   Color, Urine AMBER (*)    APPearance TURBID (*)    Hgb urine dipstick MODERATE  (*)    Protein, ur 30 (*)    Nitrite POSITIVE (*)    Leukocytes, UA LARGE (*)    Bacteria, UA MANY (*)    Squamous Epithelial / LPF 0-5 (*)    All other components within normal limits  PROTIME-INR - Abnormal; Notable for the following components:   Prothrombin Time 25.6 (*)    All other components within normal limits  POC OCCULT BLOOD, ED - Abnormal; Notable for the following components:   Fecal Occult Bld POSITIVE (*)    All other components within normal limits  POC OCCULT BLOOD, ED - Abnormal; Notable for the following components:   Fecal Occult Bld POSITIVE (*)    All other components within normal limits  URINE CULTURE  LIPASE, BLOOD    EKG  EKG Interpretation None       Radiology Ct Abdomen Pelvis Wo Contrast  Result Date: 10/13/2017 CLINICAL DATA:  Abdominal distension EXAM: CT ABDOMEN AND PELVIS WITHOUT CONTRAST TECHNIQUE: Multidetector CT imaging of the abdomen and pelvis was performed following the standard protocol without IV contrast. COMPARISON:  None. FINDINGS: Lower chest: No acute abnormality. Hepatobiliary: Liver is within normal limits. The gallbladder is well distended with multiple gallstones. No wall thickening is seen. Pancreas: Unremarkable. No pancreatic ductal dilatation or surrounding inflammatory changes. Spleen: Normal in size without focal abnormality. Adrenals/Urinary Tract: The adrenal glands are within normal limits. The kidneys are well visualized bilaterally. Tiny nonobstructing stone is noted in the upper pole of the left kidney with some associated scarring. Bladder is well distended. Stomach/Bowel: The appendix has been surgically removed. No obstructive or inflammatory changes are noted. Small hiatal hernia is seen. Some ingested food stuffs are noted within the hernia. Vascular/Lymphatic: Aortic atherosclerosis. No enlarged abdominal or pelvic lymph nodes. Reproductive: Status post hysterectomy. No adnexal masses. Other: No abdominal wall hernia  or abnormality. No abdominopelvic ascites. Musculoskeletal: Degenerative changes of the lumbar spine are noted. Changes of prior vertebral augmentation T12 are noted. IMPRESSION: Cholelithiasis without complicating factors. Tiny nonobstructing stone in the upper pole of the left kidney. No acute abnormality is identified. Electronically Signed   By:  Inez Catalina M.D.   On: 10/13/2017 21:03    Procedures Procedures (including critical care time)  Medications Ordered in ED Medications  sodium chloride 0.9 % bolus 1,000 mL (0 mLs Intravenous Stopped 10/13/17 2237)  cefTRIAXone (ROCEPHIN) 1 g in sodium chloride 0.9 % 100 mL IVPB (0 g Intravenous Stopped 10/13/17 2321)     Initial Impression / Assessment and Plan / ED Course  I have reviewed the triage vital signs and the nursing notes.  Pertinent labs & imaging results that were available during my care of the patient were reviewed by me and considered in my medical decision making (see chart for details).    Patient with cystitis.  She will be sent home on Keflex and will follow up with her PCP  Final Clinical Impressions(s) / ED Diagnoses   Final diagnoses:  Acute cystitis with hematuria    ED Discharge Orders        Ordered    cephALEXin (KEFLEX) 500 MG capsule  4 times daily     10/13/17 2339       Milton Ferguson, MD 10/14/17 1505

## 2017-10-16 LAB — URINE CULTURE: Culture: >=100000 — AB

## 2017-10-17 ENCOUNTER — Telehealth: Payer: Self-pay | Admitting: Emergency Medicine

## 2017-10-17 NOTE — Telephone Encounter (Signed)
Post ED Visit - Positive Culture Follow-up  Culture report reviewed by antimicrobial stewardship pharmacist:  []  Elenor Quinones, Pharm.D. []  Heide Guile, Pharm.D., BCPS AQ-ID []  Parks Neptune, Pharm.D., BCPS [x]  Alycia Rossetti, Pharm.D., BCPS []  [place], Pharm.D., BCPS, AAHIVP []  Legrand Como, Pharm.D., BCPS, AAHIVP []  Salome Arnt, PharmD, BCPS []  Jalene Mullet, PharmD []  Vincenza Hews, PharmD, BCPS  Positive urine culture Treated with cephalexin, organism sensitive to the same and no further patient follow-up is required at this time.  Hazle Nordmann 10/17/2017, 11:44 AM

## 2017-10-31 ENCOUNTER — Ambulatory Visit (INDEPENDENT_AMBULATORY_CARE_PROVIDER_SITE_OTHER): Payer: Medicare PPO | Admitting: *Deleted

## 2017-10-31 DIAGNOSIS — I482 Chronic atrial fibrillation, unspecified: Secondary | ICD-10-CM

## 2017-10-31 DIAGNOSIS — Z7901 Long term (current) use of anticoagulants: Secondary | ICD-10-CM

## 2017-10-31 DIAGNOSIS — Z5181 Encounter for therapeutic drug level monitoring: Secondary | ICD-10-CM

## 2017-10-31 LAB — POCT INR: INR: 1.6

## 2017-10-31 NOTE — Patient Instructions (Signed)
Take 1 tablet tonight and tomorrow night then increase dose to 1 tablet daily except 1/2 tablet on Sundays, Tuesdays and Thursdays Recheck in 3 weeks

## 2017-11-24 ENCOUNTER — Other Ambulatory Visit (HOSPITAL_COMMUNITY)
Admission: RE | Admit: 2017-11-24 | Discharge: 2017-11-24 | Disposition: A | Discharge status: Home / Self Care | Payer: Medicare PPO | Source: Ambulatory Visit | Attending: Family Medicine | Admitting: Family Medicine

## 2017-11-24 DIAGNOSIS — R7989 Other specified abnormal findings of blood chemistry: Secondary | ICD-10-CM | POA: Insufficient documentation

## 2017-11-24 DIAGNOSIS — M545 Low back pain: Secondary | ICD-10-CM | POA: Diagnosis not present

## 2017-11-24 DIAGNOSIS — J301 Allergic rhinitis due to pollen: Secondary | ICD-10-CM | POA: Diagnosis not present

## 2017-11-24 DIAGNOSIS — N182 Chronic kidney disease, stage 2 (mild): Secondary | ICD-10-CM | POA: Diagnosis not present

## 2017-11-24 DIAGNOSIS — I10 Essential (primary) hypertension: Secondary | ICD-10-CM | POA: Insufficient documentation

## 2017-11-24 DIAGNOSIS — I482 Chronic atrial fibrillation: Secondary | ICD-10-CM | POA: Insufficient documentation

## 2017-11-24 DIAGNOSIS — Z7901 Long term (current) use of anticoagulants: Secondary | ICD-10-CM | POA: Diagnosis not present

## 2017-11-24 DIAGNOSIS — F0390 Unspecified dementia without behavioral disturbance: Secondary | ICD-10-CM | POA: Insufficient documentation

## 2017-11-24 LAB — BASIC METABOLIC PANEL
ANION GAP: 10 (ref 5–15)
BUN: 18 mg/dL (ref 6–20)
CALCIUM: 10 mg/dL (ref 8.9–10.3)
CO2: 28 mmol/L (ref 22–32)
Chloride: 104 mmol/L (ref 101–111)
Creatinine, Ser: 1.38 mg/dL — ABNORMAL HIGH (ref 0.44–1.00)
GFR, EST AFRICAN AMERICAN: 40 mL/min — AB (ref >60)
GFR, EST NON AFRICAN AMERICAN: 35 mL/min — AB (ref >60)
Glucose, Bld: 99 mg/dL (ref 65–99)
POTASSIUM: 4 mmol/L (ref 3.5–5.1)
Sodium: 142 mmol/L (ref 135–145)

## 2017-11-30 ENCOUNTER — Ambulatory Visit (INDEPENDENT_AMBULATORY_CARE_PROVIDER_SITE_OTHER): Payer: Medicare PPO | Admitting: *Deleted

## 2017-11-30 DIAGNOSIS — Z5181 Encounter for therapeutic drug level monitoring: Secondary | ICD-10-CM

## 2017-11-30 DIAGNOSIS — Z7901 Long term (current) use of anticoagulants: Secondary | ICD-10-CM

## 2017-11-30 DIAGNOSIS — I482 Chronic atrial fibrillation, unspecified: Secondary | ICD-10-CM

## 2017-11-30 LAB — POCT INR: INR: 3.3

## 2017-11-30 NOTE — Patient Instructions (Signed)
Take 1/2 tablet tonight then decrease dose to 1/2 tablet daily except 1 tablet on Mondays, Wednesdays and Fridays Recheck in 4 weeks

## 2017-12-28 ENCOUNTER — Ambulatory Visit (INDEPENDENT_AMBULATORY_CARE_PROVIDER_SITE_OTHER): Payer: Medicare PPO | Admitting: *Deleted

## 2017-12-28 DIAGNOSIS — I482 Chronic atrial fibrillation, unspecified: Secondary | ICD-10-CM

## 2017-12-28 DIAGNOSIS — Z5181 Encounter for therapeutic drug level monitoring: Secondary | ICD-10-CM

## 2017-12-28 DIAGNOSIS — Z7901 Long term (current) use of anticoagulants: Secondary | ICD-10-CM | POA: Diagnosis not present

## 2017-12-28 LAB — POCT INR: INR: 2.1

## 2017-12-28 NOTE — Patient Instructions (Signed)
Continue coumadin 1/2 tablet daily except 1 tablet on Mondays, Wednesdays and Fridays Recheck in 4 weeks 

## 2018-01-23 ENCOUNTER — Emergency Department (HOSPITAL_COMMUNITY)
Admission: EM | Admit: 2018-01-23 | Discharge: 2018-01-23 | Disposition: A | Discharge status: Home / Self Care | Payer: Medicare PPO | Attending: Emergency Medicine | Admitting: Emergency Medicine

## 2018-01-23 ENCOUNTER — Encounter (HOSPITAL_COMMUNITY): Payer: Self-pay | Admitting: Emergency Medicine

## 2018-01-23 ENCOUNTER — Other Ambulatory Visit: Payer: Self-pay

## 2018-01-23 ENCOUNTER — Emergency Department (HOSPITAL_COMMUNITY): Payer: Medicare PPO

## 2018-01-23 DIAGNOSIS — Z79899 Other long term (current) drug therapy: Secondary | ICD-10-CM | POA: Diagnosis not present

## 2018-01-23 DIAGNOSIS — Z7901 Long term (current) use of anticoagulants: Secondary | ICD-10-CM | POA: Insufficient documentation

## 2018-01-23 DIAGNOSIS — K5641 Fecal impaction: Secondary | ICD-10-CM | POA: Diagnosis not present

## 2018-01-23 DIAGNOSIS — G309 Alzheimer's disease, unspecified: Secondary | ICD-10-CM | POA: Diagnosis not present

## 2018-01-23 DIAGNOSIS — I10 Essential (primary) hypertension: Secondary | ICD-10-CM | POA: Insufficient documentation

## 2018-01-23 DIAGNOSIS — Z87891 Personal history of nicotine dependence: Secondary | ICD-10-CM | POA: Insufficient documentation

## 2018-01-23 DIAGNOSIS — C760 Malignant neoplasm of head, face and neck: Secondary | ICD-10-CM | POA: Insufficient documentation

## 2018-01-23 DIAGNOSIS — R791 Abnormal coagulation profile: Secondary | ICD-10-CM | POA: Insufficient documentation

## 2018-01-23 DIAGNOSIS — K5900 Constipation, unspecified: Secondary | ICD-10-CM

## 2018-01-23 DIAGNOSIS — C770 Secondary and unspecified malignant neoplasm of lymph nodes of head, face and neck: Secondary | ICD-10-CM | POA: Insufficient documentation

## 2018-01-23 LAB — CBC WITH DIFFERENTIAL/PLATELET
BASOS PCT: 0 %
Basophils Absolute: 0 10*3/uL (ref 0.0–0.1)
EOS PCT: 1 %
Eosinophils Absolute: 0.1 10*3/uL (ref 0.0–0.7)
HEMATOCRIT: 44.9 % (ref 36.0–46.0)
Hemoglobin: 14.9 g/dL (ref 12.0–15.0)
LYMPHS PCT: 20 %
Lymphs Abs: 1.4 10*3/uL (ref 0.7–4.0)
MCH: 32.3 pg (ref 26.0–34.0)
MCHC: 33.2 g/dL (ref 30.0–36.0)
MCV: 97.2 fL (ref 78.0–100.0)
MONO ABS: 0.6 10*3/uL (ref 0.1–1.0)
MONOS PCT: 8 %
Neutro Abs: 5 10*3/uL (ref 1.7–7.7)
Neutrophils Relative %: 71 %
Platelets: 192 10*3/uL (ref 150–400)
RBC: 4.62 MIL/uL (ref 3.87–5.11)
RDW: 13.1 % (ref 11.5–15.5)
WBC: 7 10*3/uL (ref 4.0–10.5)

## 2018-01-23 LAB — PROTIME-INR
INR: 1.39
Prothrombin Time: 16.9 seconds — ABNORMAL HIGH (ref 11.4–15.2)

## 2018-01-23 LAB — BASIC METABOLIC PANEL
Anion gap: 12 (ref 5–15)
BUN: 27 mg/dL — ABNORMAL HIGH (ref 6–20)
CHLORIDE: 102 mmol/L (ref 101–111)
CO2: 22 mmol/L (ref 22–32)
Calcium: 9.3 mg/dL (ref 8.9–10.3)
Creatinine, Ser: 1.43 mg/dL — ABNORMAL HIGH (ref 0.44–1.00)
GFR calc Af Amer: 39 mL/min — ABNORMAL LOW (ref >60)
GFR calc non Af Amer: 33 mL/min — ABNORMAL LOW (ref >60)
GLUCOSE: 125 mg/dL — AB (ref 65–99)
POTASSIUM: 3.9 mmol/L (ref 3.5–5.1)
Sodium: 136 mmol/L (ref 135–145)

## 2018-01-23 MED ORDER — BISACODYL 10 MG RE SUPP
10.0000 mg | Freq: Once | RECTAL | Status: AC
  Administered 2018-01-23: 10 mg via RECTAL
  Filled 2018-01-23: qty 1

## 2018-01-23 MED ORDER — POLYETHYLENE GLYCOL 3350 17 G PO PACK: 17.0000 g | PACK | Freq: Every day | ORAL | 0 refills | Status: AC

## 2018-01-23 NOTE — Discharge Instructions (Addendum)
You may improve your constipation by taking miralax daily - this will help to keep your stool softer, or, you may want to start taking your over the counter stool softener again.

## 2018-01-23 NOTE — ED Triage Notes (Signed)
Patient c/o constipation x3-4 days. Per patient normally has BM everyday. Patient states rectal pain/pressure and "sweats." Denies any nausea, vomiting, or fevers. Patient reports using stool softeners with no relief.

## 2018-01-23 NOTE — ED Notes (Signed)
Notified Almyra Free PA that pt had some blood in stool. Will notify xray she does not want it anymore.

## 2018-01-23 NOTE — ED Notes (Signed)
Pt tried to have a BM but no success.

## 2018-01-23 NOTE — ED Notes (Signed)
Xray came to get patient, but patient using bedside commode.

## 2018-01-25 NOTE — ED Provider Notes (Signed)
[facility] EMERGENCY DEPARTMENT Provider Note   CSN: [id number] Arrival date & time: 01/23/18  0920     History   Chief Complaint Chief Complaint  Patient presents with  . Constipation    HPI Jaclyn Schultz is a 84 y.o. female with a history of afib on coumadin therapy, metastatic squamous cell cancer, htn and is on daily on hydrocodone for orthopedic pain presenting with constipation. Her pain medicine causes constipation so she usually also takes a senakot stool softener which treats this sx.  She had looser than normal stools so she stopped taking the senakot last week, now has severe pain at her rectum and has not had a bm in 4 days.  She is passes small amounts of liquid stool which has been blood tinged but without relief of pain or pressure sensation.  Dg at bedside endorses she had some bleeding the last time she was constipated as well. Her last INR checked 5/1 was therapeutic.  The history is provided by the patient and a relative.    Past Medical History:  Diagnosis Date  . A-fib ([place])   . Cancer ([place])    mouth/dx 08-2010/surg only  . Dementia    Early  . High cholesterol   . Hypertension   . Osteoporosis   . Vertigo   . Vitamin B 12 deficiency     Patient Active Problem List   Diagnosis Date Noted  . 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 Esophageal abnormality 02/03/2016  . Alzheimer's dementia without behavioral disturbance 02/03/2016  . Primary squamous cell carcinoma of head and neck ([place]) 01/30/2016  . Diarrhea 06/30/2015  . FH: colon cancer 06/30/2015  . Foot fracture, left 10/09/2013  . Encounter for therapeutic drug monitoring 09/26/2013  . Metatarsal fracture 08/14/2013  . Bronchitis, acute 07/12/2013  . SOB (shortness of breath) 07/12/2013  . 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 Chronic periodontitis, unspecified 03/22/2013  . Allergic rhinitis due to pollen 03/22/2013  . Cerebral  degeneration in diseases classified elsewhere(331.7) 03/22/2013  . Malignant neoplasm of head, face, and neck ([place]) 03/22/2013  . Other B-complex deficiencies 03/22/2013  . Mixed hyperlipidemia 03/22/2013  . Essential hypertension, benign 03/22/2013  . Reflux esophagitis 03/22/2013  . Senile osteoporosis 03/22/2013  . Atrial fibrillation, chronic ([place]) 11/05/2012  . Long term (current) use of anticoagulants 11/05/2012    Past Surgical History:  Procedure Laterality Date  . APPENDECTOMY    . BIOPSY N/A 07/08/2015   Procedure: BIOPSY;  Surgeon: Daneil Dolin, MD;  Location[facility];  Service: Endoscopy;  Laterality: N/A;  possible random colon biopsies  . CARDIAC CATHETERIZATION  10/2009   normal coronary arteries  . CERVICAL DISC SURGERY    . COLONOSCOPY  05/27/2004   [id number] hemorrhoids/sigmoid diverticula  . COLONOSCOPY N/A 07/08/2015   Procedure: COLONOSCOPY;  Surgeon: Daneil Dolin, MD;  Location: [facility];  Service: Endoscopy;  Laterality: N/A;  3300  . IR KYPHO THORACIC WITH BONE BIOPSY  03/03/2017  . IR RADIOLOGIST EVAL & MGMT  03/22/2017  . IR RADIOLOGIST EVAL & MGMT  02/24/2017  . 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 MANDIBLE SURGERY    . SHOULDER SURGERY  remote     OB History    Gravida      Para      Term      Preterm      AB  Living  4     SAB      TAB      Ectopic      Multiple      Live Births               Home Medications    Prior to Admission medications   Medication Sig Start Date End Date Taking? Authorizing Provider  Calcium Citrate-Vitamin D (CALCIUM + D PO) Take 600 mg by mouth every morning.    Yes [provider]  cetirizine (ZYRTEC ALLERGY) 10 MG tablet Take 10 mg by mouth every morning.    Yes [provider]  donepezil (ARICEPT) 10 MG tablet Take 10 mg by mouth at bedtime. 09/06/17  Yes [provider]  hydrochlorothiazide (HYDRODIURIL) 25 MG tablet Take 12.5 mg by mouth  daily.   Yes [provider]  HYDROcodone-acetaminophen (NORCO/VICODIN) 5-325 MG tablet Take 1 tablet by mouth daily.    Yes [provider]  losartan (COZAAR) 100 MG tablet Take 1 tablet (100 mg total) by mouth daily. 03/23/13  Yes Herminio Commons, MD  memantine (NAMENDA) 5 MG tablet Take 5 mg by mouth 2 (two) times daily. 09/06/17  Yes [provider]  metoprolol tartrate (LOPRESSOR) 25 MG tablet Take 25 mg by mouth 2 (two) times daily.    Yes [provider]  potassium chloride (KLOR-CON) 10 MEQ CR tablet Take 10 mEq by mouth daily.    Yes [provider]  senna (SENOKOT) 8.6 MG tablet Take 1 tablet by mouth 3 (three) times a week.   Yes [provider]  simvastatin (ZOCOR) 40 MG tablet Take 40 mg by mouth at bedtime.    Yes [provider]  trimethoprim (TRIMPEX) 100 MG tablet Take 1 tablet by mouth daily. 05/14/15  Yes [provider]  vitamin B-12 (CYANOCOBALAMIN) 1000 MCG tablet Take 1,000 mcg by mouth daily.   Yes [provider]  warfarin (COUMADIN) 5 MG tablet Take 1 tablet daily or as directed Patient taking differently: Take 2.5-5 mg by mouth See admin instructions. 5mg  on MWF. Take 2.5mg  on all other days 09/10/14  Yes Herminio Commons, MD  cephALEXin (KEFLEX) 500 MG capsule Take 1 capsule (500 mg total) by mouth 4 (four) times daily. Patient not taking: Reported on 01/23/2018 10/13/17   Milton Ferguson, MD  polyethylene glycol [facility] / Floria Raveling) packet Take 17 g by mouth daily. 01/23/18   Evalee Jefferson, PA-C    Family History Family History  Problem Relation Age of Onset  . Colon cancer Sister        age 24    Social History Social History   Tobacco Use  . Smoking status: Never Smoker  . Smokeless tobacco: Former Systems developer    Types: Chew  . Tobacco comment: Never smoked  Substance Use Topics  . Alcohol use: No    Alcohol/week: 0.0 oz  . Drug use: No     Allergies   Erythromycin  base   Review of Systems Review of Systems  Constitutional: Negative for fever.  HENT: Negative for congestion and sore throat.   Eyes: Negative.   Respiratory: Negative for chest tightness and shortness of breath.   Cardiovascular: Negative for chest pain.  Gastrointestinal: Positive for anal bleeding, constipation and rectal pain. Negative for abdominal pain, nausea and vomiting.  Genitourinary: Negative.   Musculoskeletal: Negative for arthralgias, joint swelling and neck pain.  Skin: Negative.  Negative for rash and wound.  Neurological: Negative for  dizziness, weakness, light-headedness, numbness and headaches.  Psychiatric/Behavioral: Negative.      Physical Exam Updated Vital Signs BP (!) 160/60 (BP Location: Left Arm)   Pulse 76   Temp (!) 97.5 F (36.4 C) (Oral)   Resp 14   Ht 5\' 4"  (1.626 m)   Wt 73.9 kg (163 lb)   SpO2 100%   BMI 27.98 kg/m   Physical Exam  Constitutional: She appears well-developed and well-nourished.  HENT:  Head: Normocephalic and atraumatic.  Eyes: Conjunctivae are normal.  Neck: Normal range of motion.  Cardiovascular: Normal rate, regular rhythm, normal heart sounds and intact distal pulses.  Pulmonary/Chest: Effort normal and breath sounds normal. She has no wheezes.  Abdominal: Soft. Bowel sounds are normal. She exhibits no mass. There is no tenderness. There is no guarding.  Genitourinary:  Genitourinary Comments: Fecal impaction.  Trace soft stool in pt's depends, blood tinged without being grossly bloody. Pt did not tolerate attempt at disimpaction.  Chaperone was present during exam.   Musculoskeletal: Normal range of motion.  Neurological: She is alert.  Skin: Skin is warm and dry.  Psychiatric: She has a normal mood and affect.  Nursing note and vitals reviewed.    ED Treatments / Results  Labs (all labs ordered are listed, but only abnormal results are displayed) Labs Reviewed  BASIC METABOLIC PANEL - Abnormal;  Notable for the following components:      Result Value   Glucose, Bld 125 (*)    BUN 27 (*)    Creatinine, Ser 1.43 (*)    GFR calc non Af Amer 33 (*)    GFR calc Af Amer 39 (*)    All other components within normal limits  PROTIME-INR - Abnormal; Notable for the following components:   Prothrombin Time 16.9 (*)    All other components within normal limits  CBC WITH DIFFERENTIAL/PLATELET    EKG None  Radiology No results found.  Procedures Procedures (including critical care time)  Pt was given a dulcolax suppository after which she was able to have a BM with complete relief of her rectal pain.  She had no further active bleeding per rectum at recheck prior to dc home.  Medications Ordered in ED Medications  bisacodyl (DULCOLAX) suppository 10 mg (10 mg Rectal Given 01/23/18 1041)     Initial Impression / Assessment and Plan / ED Course  I have reviewed the triage vital signs and the nursing notes.  Pertinent labs & imaging results that were available during my care of the patient were reviewed by me and considered in my medical decision making (see chart for details).     Labs reviewed including subtherapeutic INR. Advised dg to call cardiology in am to get tx plan for the coumadin to get her INR within therapeutic range.  Also advised to continue taking her home stool softener daily or try miralax which was prescribed.    Labs stable, hgb stable. No active GI bleeding at this time, suspect rectal/anal source from straining.  Advised prn f/u here for any worsening of bleeding.    Final Clinical Impressions(s) / ED Diagnoses   Final diagnoses:  Fecal impaction in rectum (HCC)  Constipation, unspecified constipation type  Subtherapeutic international normalized ratio (INR)    ED Discharge Orders        Ordered    polyethylene glycol (MIRALAX / GLYCOLAX) packet  Daily     01/23/18 1257       Evalee Jefferson, PA-C 01/25/18 1420  Francine Graven,  DO 01/28/18 [phone]

## 2018-01-27 ENCOUNTER — Ambulatory Visit (INDEPENDENT_AMBULATORY_CARE_PROVIDER_SITE_OTHER): Payer: Medicare PPO | Admitting: *Deleted

## 2018-01-27 DIAGNOSIS — Z5181 Encounter for therapeutic drug level monitoring: Secondary | ICD-10-CM

## 2018-01-27 DIAGNOSIS — Z7901 Long term (current) use of anticoagulants: Secondary | ICD-10-CM

## 2018-01-27 DIAGNOSIS — I482 Chronic atrial fibrillation, unspecified: Secondary | ICD-10-CM

## 2018-01-27 LAB — POCT INR: INR: 1.7 — AB (ref 2.0–3.0)

## 2018-01-27 NOTE — Patient Instructions (Signed)
Take coumadin 1 1/2 tablets tonight then resume 1/2 tablet daily except 1 tablet on Mondays, Wednesdays and Fridays Recheck in 3 weeks

## 2018-03-29 ENCOUNTER — Ambulatory Visit (INDEPENDENT_AMBULATORY_CARE_PROVIDER_SITE_OTHER): Payer: Medicare PPO | Admitting: *Deleted

## 2018-03-29 DIAGNOSIS — I4891 Unspecified atrial fibrillation: Secondary | ICD-10-CM

## 2018-03-29 DIAGNOSIS — Z5181 Encounter for therapeutic drug level monitoring: Secondary | ICD-10-CM

## 2018-03-29 LAB — POCT INR: INR: 2.9 (ref 2.0–3.0)

## 2018-03-29 NOTE — Patient Instructions (Signed)
Continue coumadin 1/2 tablet daily except 1 tablet on Mondays, Wednesdays and Fridays Recheck in 4 weeks 

## 2018-04-26 ENCOUNTER — Ambulatory Visit (INDEPENDENT_AMBULATORY_CARE_PROVIDER_SITE_OTHER): Payer: Medicare PPO | Admitting: *Deleted

## 2018-04-26 DIAGNOSIS — Z5181 Encounter for therapeutic drug level monitoring: Secondary | ICD-10-CM | POA: Diagnosis not present

## 2018-04-26 DIAGNOSIS — I4891 Unspecified atrial fibrillation: Secondary | ICD-10-CM | POA: Diagnosis not present

## 2018-04-26 LAB — POCT INR: INR: 3.5 — AB (ref 2.0–3.0)

## 2018-04-26 NOTE — Patient Instructions (Signed)
Hold coumadin tonight then resume 1/2 tablet daily except 1 tablet on Mondays, Wednesdays and Fridays Recheck in 3 weeks

## 2018-05-29 ENCOUNTER — Ambulatory Visit (INDEPENDENT_AMBULATORY_CARE_PROVIDER_SITE_OTHER): Payer: Medicare PPO | Admitting: *Deleted

## 2018-05-29 DIAGNOSIS — I4891 Unspecified atrial fibrillation: Secondary | ICD-10-CM | POA: Diagnosis not present

## 2018-05-29 DIAGNOSIS — Z5181 Encounter for therapeutic drug level monitoring: Secondary | ICD-10-CM | POA: Diagnosis not present

## 2018-05-29 LAB — POCT INR: INR: 3.2 — AB (ref 2.0–3.0)

## 2018-05-29 NOTE — Patient Instructions (Signed)
Take coumadin 1/2 tablet tonight then decrease dose to 1/2 tablet daily except 1 tablet on Mondays and Fridays Recheck in 4 weeks

## 2018-07-03 ENCOUNTER — Ambulatory Visit (INDEPENDENT_AMBULATORY_CARE_PROVIDER_SITE_OTHER): Payer: Medicare PPO | Admitting: *Deleted

## 2018-07-03 DIAGNOSIS — I4891 Unspecified atrial fibrillation: Secondary | ICD-10-CM

## 2018-07-03 DIAGNOSIS — Z5181 Encounter for therapeutic drug level monitoring: Secondary | ICD-10-CM

## 2018-07-03 LAB — POCT INR: INR: 2.4 (ref 2.0–3.0)

## 2018-07-03 NOTE — Patient Instructions (Signed)
Continue coumadin 1/2 tablet daily except 1 tablet on Mondays and Fridays Recheck in 4 weeks 

## 2018-07-25 ENCOUNTER — Telehealth: Payer: Self-pay | Admitting: Cardiology

## 2018-07-25 NOTE — Telephone Encounter (Signed)
Pt is having swelling in feet and SOB please call [phone]

## 2018-07-25 NOTE — Telephone Encounter (Addendum)
   With her having not been evaluated by Cardiology in over 4 years, I do not feel comfortable making changes to her medication regimen based off of this (due to changes in medications and to her history during this timeframe). Would recommend close follow-up with her PCP. If Cardiology referral is then indicated, would need to have a New Patient visit.    If unable to see her PCP soon or if dyspnea acutely worsens, then she needs to proceed to the ED for emergent evaluation.   Signed, Erma Heritage, PA-C 07/25/2018, 12:53 PM Pager: [phone]

## 2018-07-25 NOTE — Telephone Encounter (Signed)
Pt notified and voiced understanding 

## 2018-07-25 NOTE — Telephone Encounter (Signed)
Spoke with daughter who reports that pt is c/o swelling to feet over the last month and increased SOB over the last week. No other complaints at this time. Daughter states she has not noticed swelling to feet. Current wt is 155lbs. Pt was last seen by K.Lawrence on 06/03/14.

## 2018-08-07 ENCOUNTER — Ambulatory Visit (INDEPENDENT_AMBULATORY_CARE_PROVIDER_SITE_OTHER): Payer: Medicare PPO | Admitting: *Deleted

## 2018-08-07 ENCOUNTER — Ambulatory Visit (HOSPITAL_COMMUNITY)
Admission: RE | Admit: 2018-08-07 | Discharge: 2018-08-07 | Disposition: A | Discharge status: Home / Self Care | Payer: Medicare PPO | Source: Ambulatory Visit | Attending: Family Medicine | Admitting: Family Medicine

## 2018-08-07 ENCOUNTER — Other Ambulatory Visit (HOSPITAL_COMMUNITY)
Admission: RE | Admit: 2018-08-07 | Discharge: 2018-08-07 | Disposition: A | Discharge status: Home / Self Care | Payer: Medicare PPO | Source: Ambulatory Visit | Attending: Family Medicine | Admitting: Family Medicine

## 2018-08-07 ENCOUNTER — Other Ambulatory Visit (HOSPITAL_COMMUNITY): Payer: Self-pay | Admitting: Family Medicine

## 2018-08-07 DIAGNOSIS — J069 Acute upper respiratory infection, unspecified: Secondary | ICD-10-CM | POA: Diagnosis not present

## 2018-08-07 DIAGNOSIS — I4891 Unspecified atrial fibrillation: Secondary | ICD-10-CM | POA: Diagnosis not present

## 2018-08-07 DIAGNOSIS — Z5181 Encounter for therapeutic drug level monitoring: Secondary | ICD-10-CM | POA: Diagnosis not present

## 2018-08-07 LAB — CBC
HEMATOCRIT: 45.2 % (ref 36.0–46.0)
HEMOGLOBIN: 14.5 g/dL (ref 12.0–15.0)
MCH: 31 pg (ref 26.0–34.0)
MCHC: 32.1 g/dL (ref 30.0–36.0)
MCV: 96.6 fL (ref 80.0–100.0)
Platelets: 188 10*3/uL (ref 150–400)
RBC: 4.68 MIL/uL (ref 3.87–5.11)
RDW: 13.7 % (ref 11.5–15.5)
WBC: 5.8 10*3/uL (ref 4.0–10.5)
nRBC: 0 % (ref 0.0–0.2)

## 2018-08-07 LAB — LIPID PANEL
CHOL/HDL RATIO: 2.8 ratio
Cholesterol: 149 mg/dL (ref 0–200)
HDL: 53 mg/dL (ref >40)
LDL CALC: 76 mg/dL (ref 0–99)
TRIGLYCERIDES: 102 mg/dL (ref <150)
VLDL: 20 mg/dL (ref 0–40)

## 2018-08-07 LAB — COMPREHENSIVE METABOLIC PANEL
ALBUMIN: 3.7 g/dL (ref 3.5–5.0)
ALK PHOS: 70 U/L (ref 38–126)
ALT: 20 U/L (ref 0–44)
AST: 24 U/L (ref 15–41)
Anion gap: 5 (ref 5–15)
BUN: 21 mg/dL (ref 8–23)
CALCIUM: 9.5 mg/dL (ref 8.9–10.3)
CO2: 26 mmol/L (ref 22–32)
Chloride: 109 mmol/L (ref 98–111)
Creatinine, Ser: 1.24 mg/dL — ABNORMAL HIGH (ref 0.44–1.00)
GFR calc non Af Amer: 40 mL/min — ABNORMAL LOW (ref >60)
GFR, EST AFRICAN AMERICAN: 47 mL/min — AB (ref >60)
GLUCOSE: 95 mg/dL (ref 70–99)
POTASSIUM: 4.2 mmol/L (ref 3.5–5.1)
SODIUM: 140 mmol/L (ref 135–145)
TOTAL PROTEIN: 6.6 g/dL (ref 6.5–8.1)
Total Bilirubin: 0.6 mg/dL (ref 0.3–1.2)

## 2018-08-07 LAB — POCT INR: INR: 1.7 — AB (ref 2.0–3.0)

## 2018-08-07 NOTE — Patient Instructions (Signed)
Increase coumadin to 1/2 tablet daily except 1 tablet on Mondays, Wednesdays and Fridays Recheck in 2 weeks

## 2018-08-21 ENCOUNTER — Ambulatory Visit (INDEPENDENT_AMBULATORY_CARE_PROVIDER_SITE_OTHER): Payer: Medicare PPO | Admitting: *Deleted

## 2018-08-21 DIAGNOSIS — Z5181 Encounter for therapeutic drug level monitoring: Secondary | ICD-10-CM

## 2018-08-21 DIAGNOSIS — I4891 Unspecified atrial fibrillation: Secondary | ICD-10-CM

## 2018-08-21 LAB — POCT INR: INR: 1.7 — AB (ref 2.0–3.0)

## 2018-08-21 IMAGING — DX DG THORACIC SPINE 3V
3 series · 3 of 3 positions shown · non-contrast
Comparison: Lumbar spine 02/14/2017. Chest x-ray 12/16/2014 . CT
09/26/2014.

CLINICAL DATA: Back pain.  Injury.

EXAM:
THORACIC SPINE - 3 VIEWS

[t-spine ap]
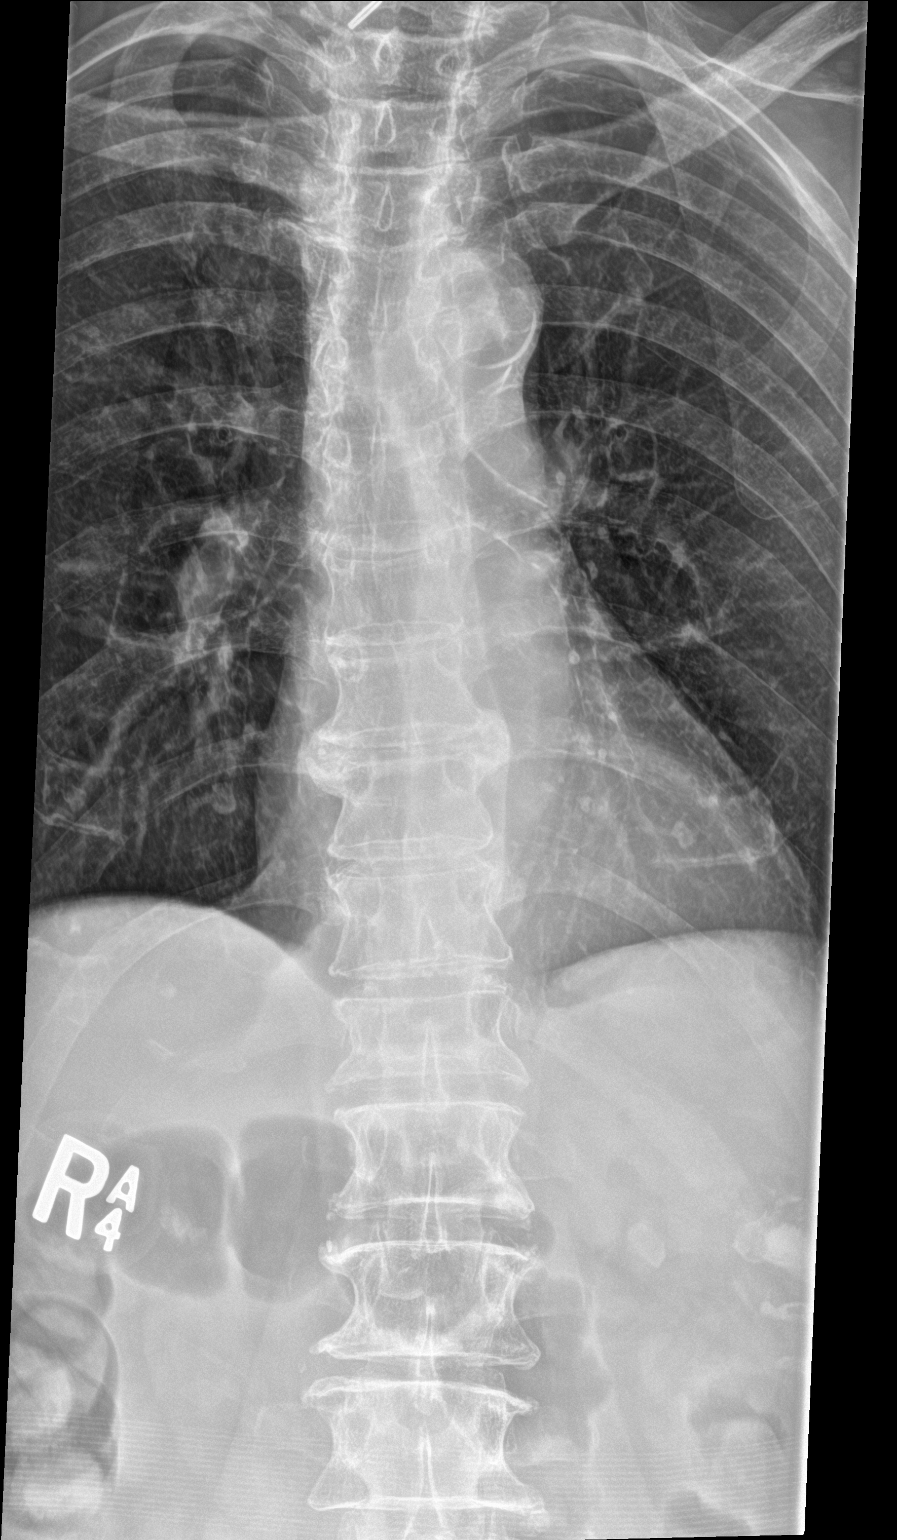

[t-spine lat]
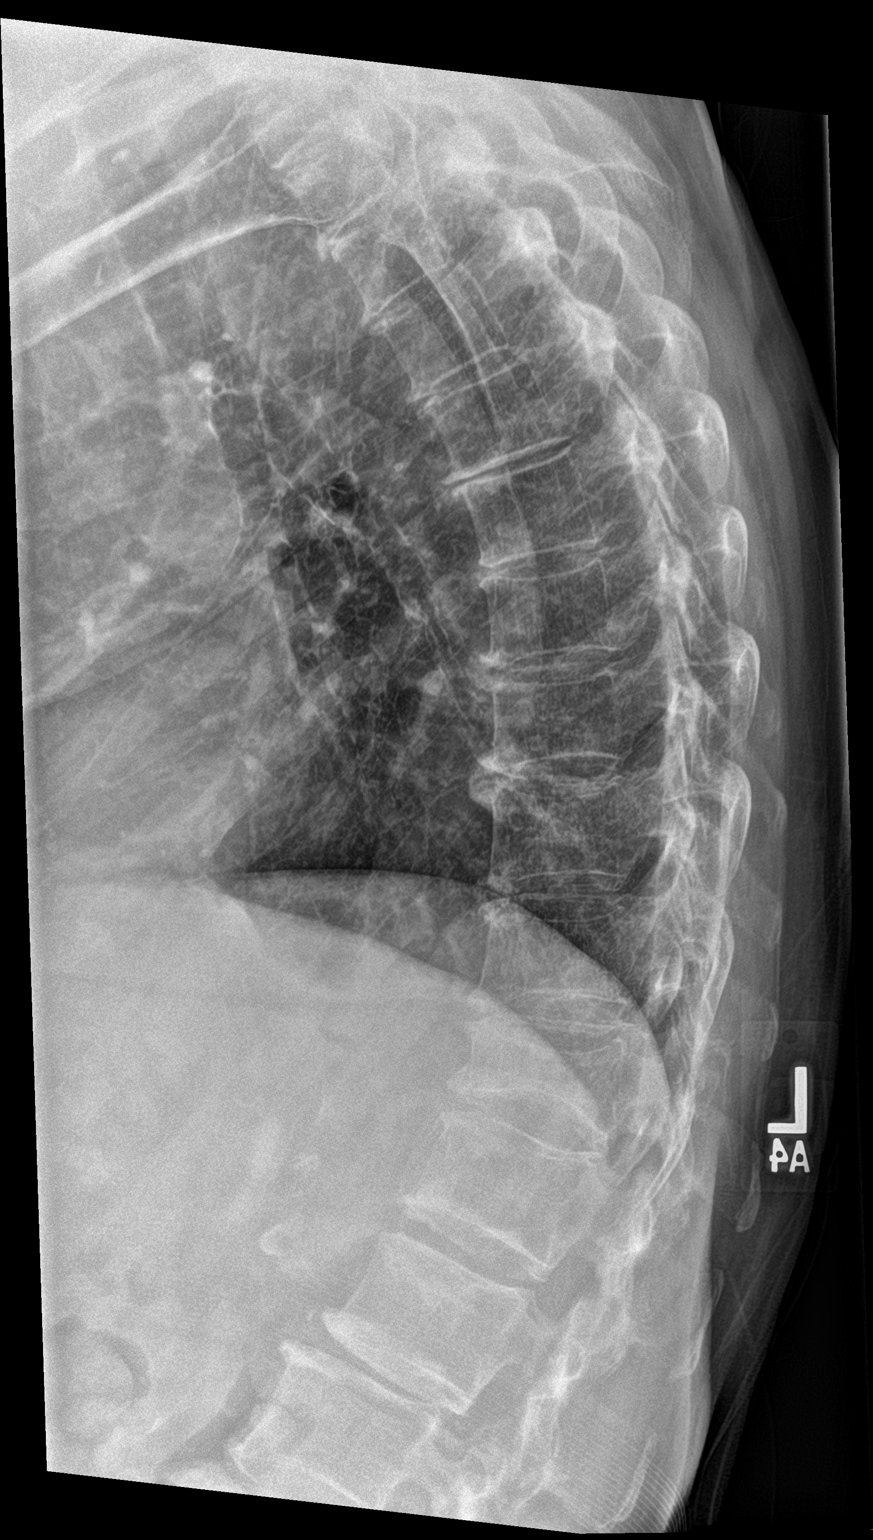

[t-spine swimmers]
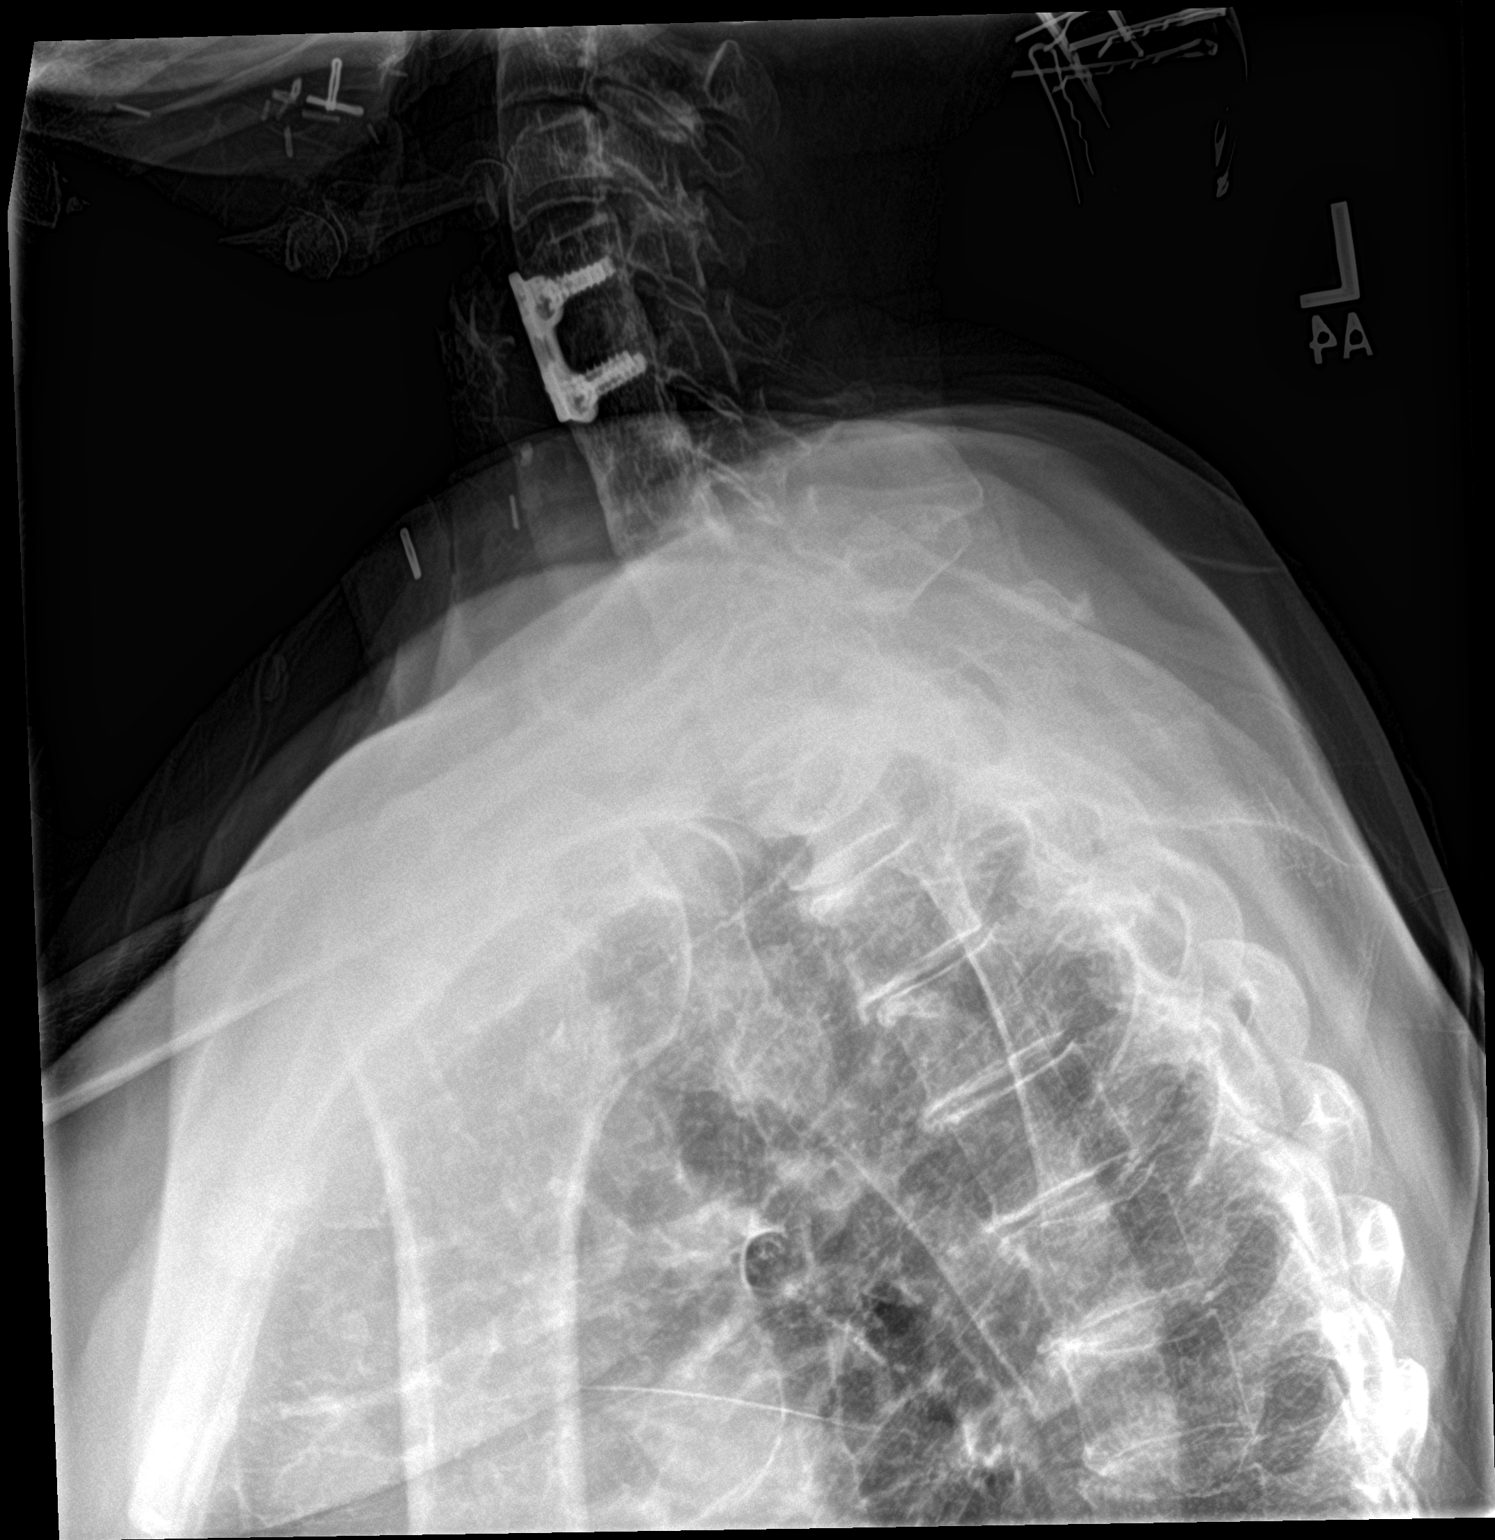

[3 of 3 positions shown; findings below may reference images not displayed]

FINDINGS: Thoracic spine scoliosis and degenerative change. A new compression
fracture T12 noted. Vertebrae are numbered with the lowest segmented
appearing lumbar shaped vertebra as L5. This is new from prior
studies.
IMPRESSION: New moderate compression fracture T12 .

## 2018-08-21 NOTE — Patient Instructions (Signed)
Take coumadin 1 1/2 tablets tonight then increase dose to 1 tablet daily except 1/2 tablet on Sundays, Tuesdays and Thursdays Recheck in 3 weeks

## 2018-08-22 IMAGING — MR MR THORACIC SPINE W/O CM
4 of 6 series · 14 of 48 positions shown · non-contrast
Comparison: Radiographs 02/22/2017 and chest CT 09/26/2014

CLINICAL DATA: Nine day history of back pain after falling. T12
compression fracture noted on radiographs from yesterday.

EXAM:
MRI THORACIC SPINE WITHOUT CONTRAST
TECHNIQUE: Multiplanar, multisequence MR imaging of the thoracic spine was
performed. No intravenous contrast was administered.

[Series 6: T1 · sagittal · 4.0mm · 0.73mm/px · 3 of 13 slices shown (1 of 2)]
[im 1/13]
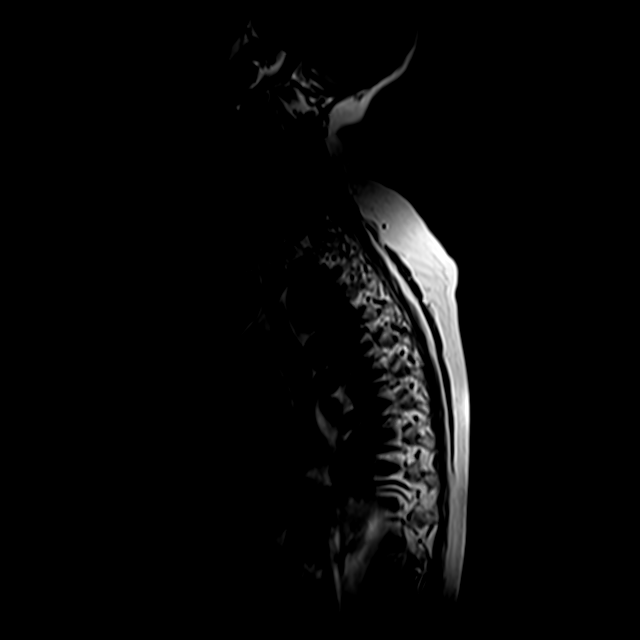
[im 7/13]
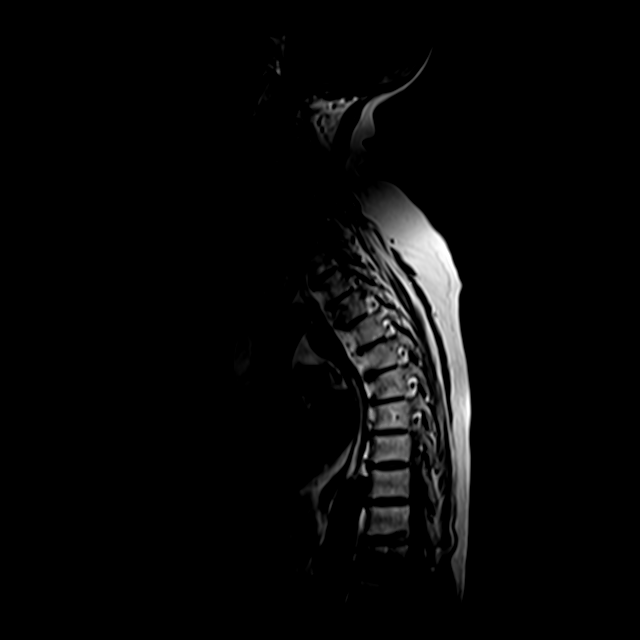
[im 13/13]
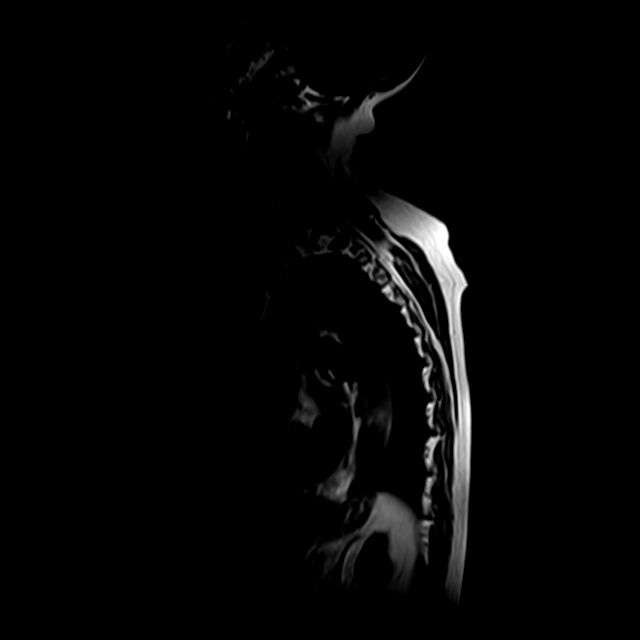

[Series 8: T2 · sagittal · 4.0mm · 0.62mm/px · 4 of 13 slices shown (1 of 2)]
[im 1/13]
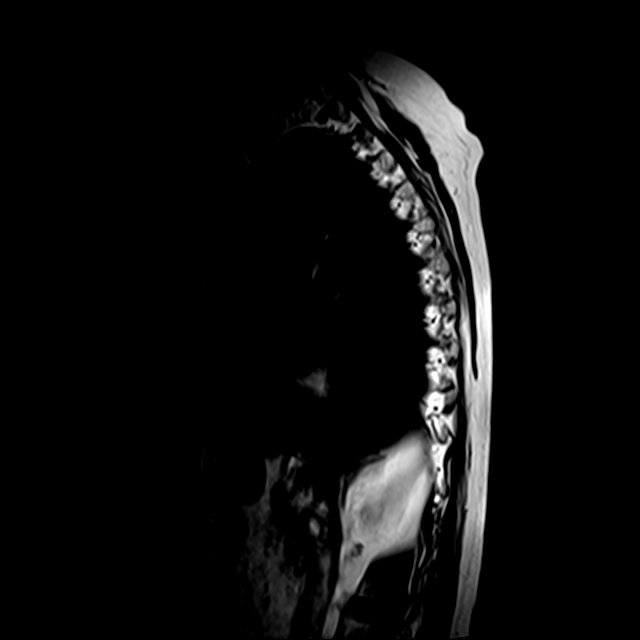
[im 5/13]
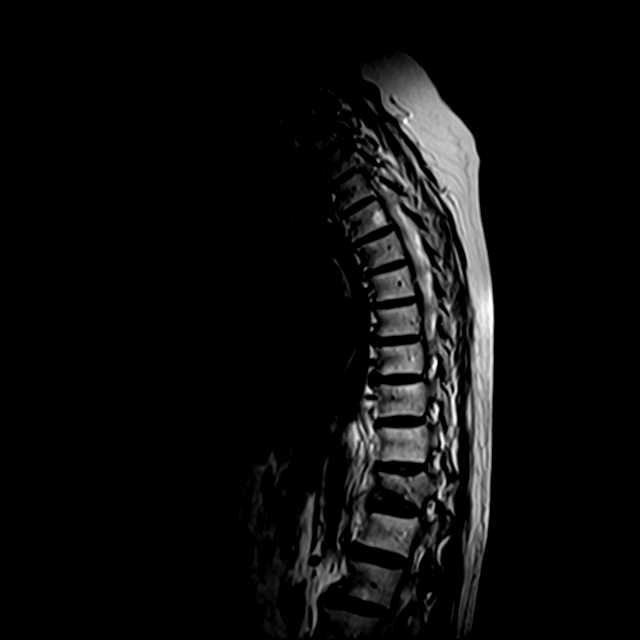
[im 9/13]
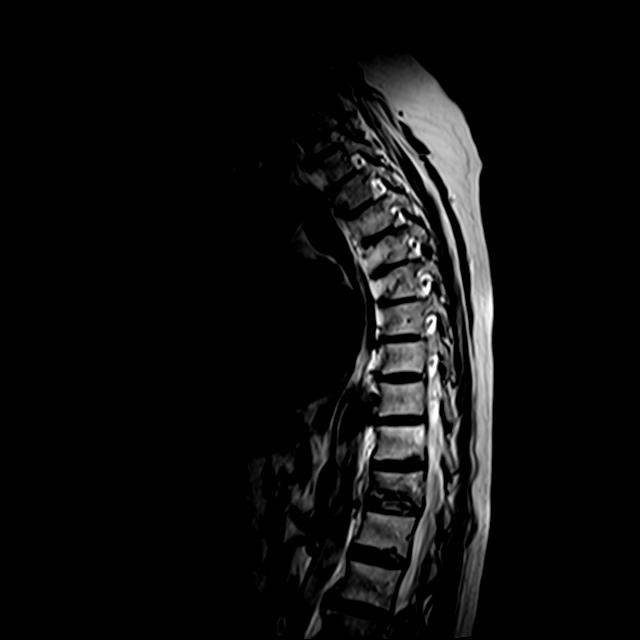
[im 13/13]
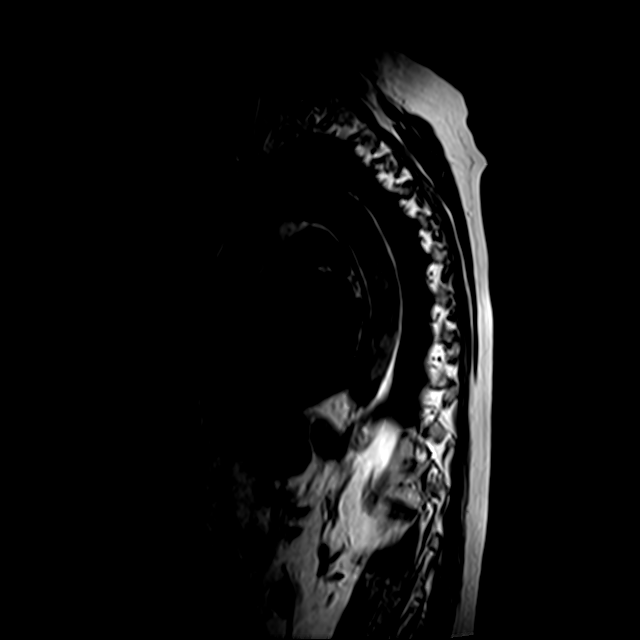

[Series 9: T1 · sagittal · 4.0mm · 0.62mm/px · 3 of 13 slices shown (2 of 2)]
[im 1/13]
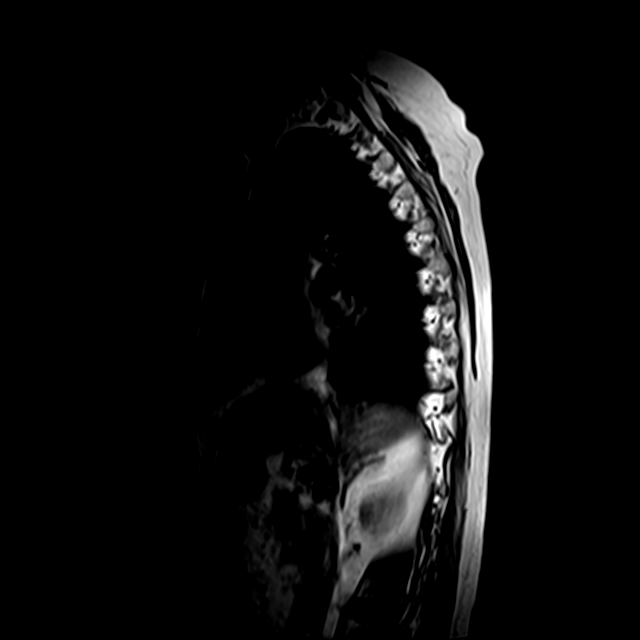
[im 9/13]
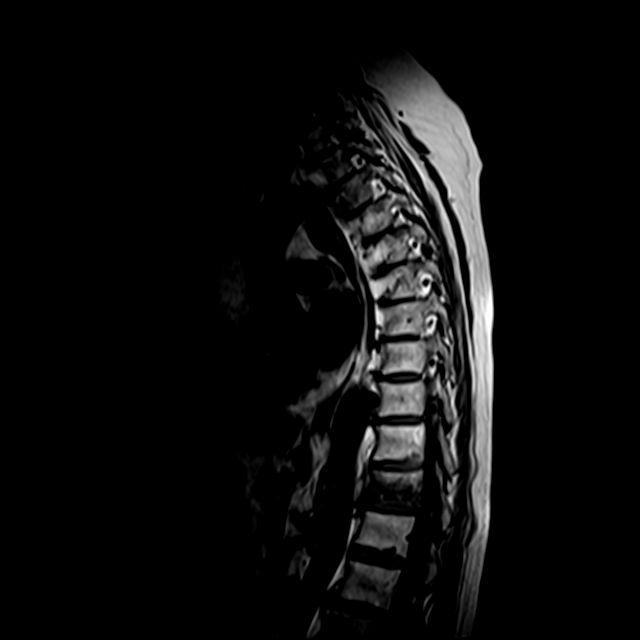
[im 13/13]
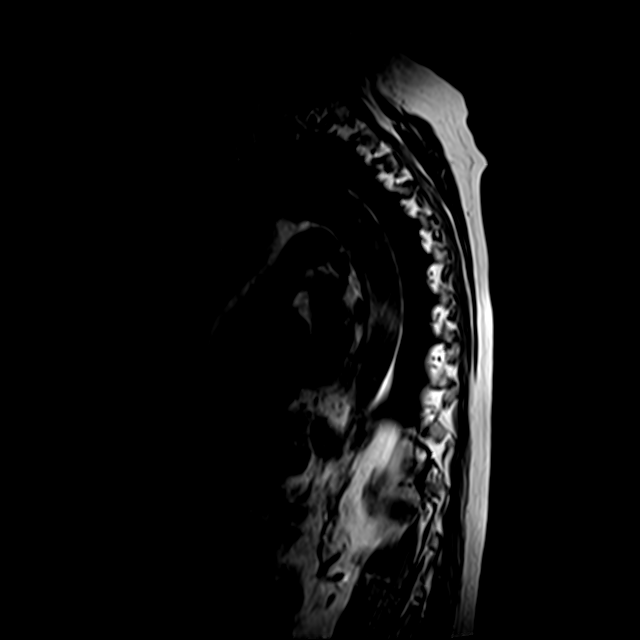

[Series 12: T2 · axial · 3.0mm · 0.25mm/px · z∈[-255,-60]mm · 4 of 47 slices shown (2 of 2)]
[im 1/47]
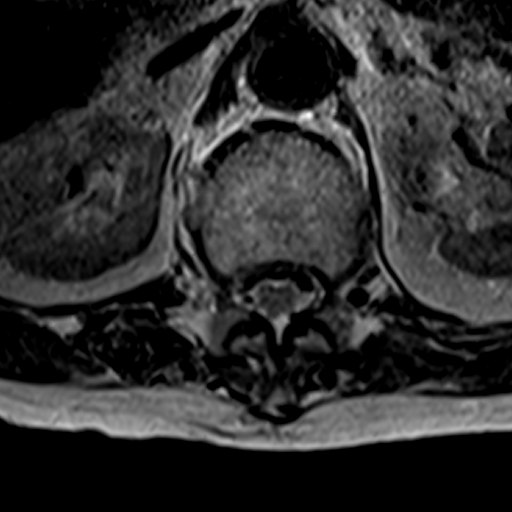
[im 7/47]
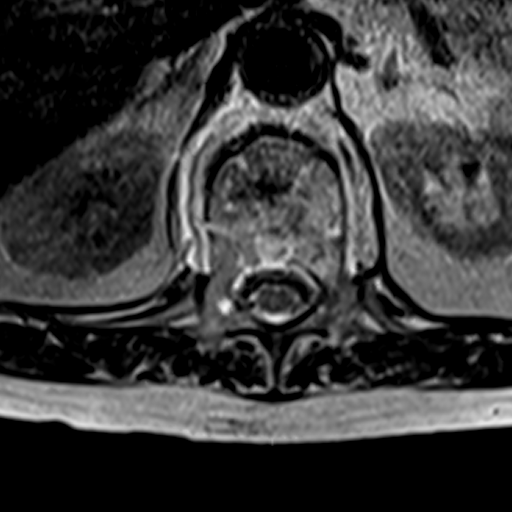
[im 24/47]
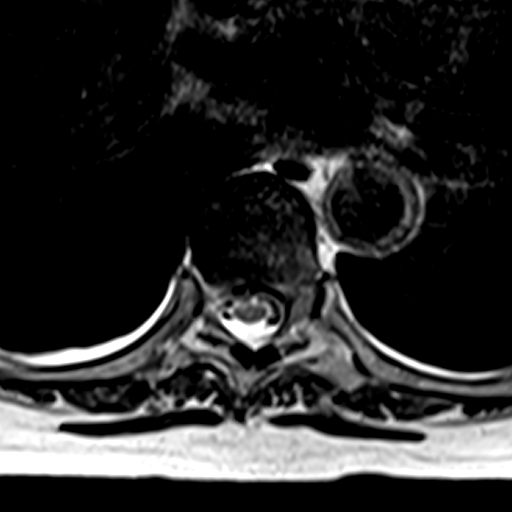
[im 40/47]
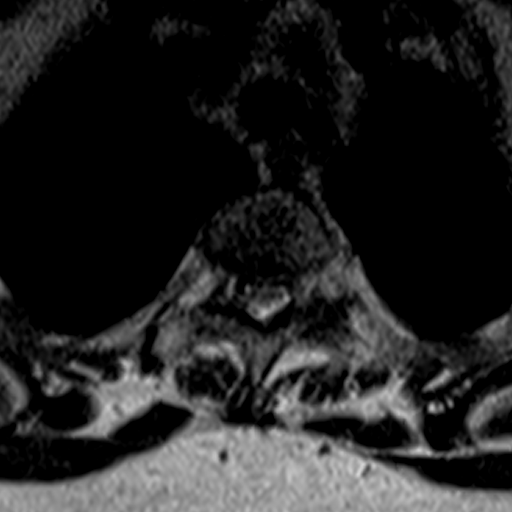

[14 of 48 positions shown; findings below may reference images not displayed]

FINDINGS: Alignment:  Normal

Vertebrae: There is a acute compression fracture of the T12
vertebral body, mainly involving the inferior endplate. Some
preserved T1 marrow signal. Approximately 25% compression. Minimal
retropulsion but no canal compromise. The other thoracic vertebral
bodies are maintained. There are surgical changes noted in the
cervical spine with anterior and interbody fusions at C4-5 and
interbody fusions at C5-6 and C6-7.

Cord: Normal wall cord signal intensity. No cord lesions or syrinx.

Paraspinal and other soft tissues: No significant findings.

Disc levels:

Right-sided disc osteophyte complex at C7-T1. There is mild mass
effect on the right side of the thecal sac and mild right foraminal
stenosis possibly irritating the right C8 nerve root.

Small central disc protrusions noted at T1-2, T2-3, T3-4 and T4-5.

Shallow broad-based disc protrusion and bulging disc at T5-6.

Small focal central disc protrusion at T6-7.

Shallow broad-based right paracentral disc protrusion at T7-8. Mild
mass effect on the thecal sac.

Tiny central disc protrusion at T8-9

Mild retropulsion noted at T12. There is slight flattening of the
disc at T12-L1 but no significant spinal or foraminal stenosis. Mild
lateral recess encroachment.
IMPRESSION: 1. Acute compression fracture of T12 with minimal retropulsion but
no canal compromise. Mild flattening of the ventral thecal sac and
mild lateral recess encroachment.
2. Shallow multilevel disc protrusions as described above. T7-8
appears to be the most significant level.
3. Normal MR appearance of the thoracic spinal cord.
4. Postsurgical changes involving the cervical spine.

## 2018-09-20 ENCOUNTER — Ambulatory Visit (INDEPENDENT_AMBULATORY_CARE_PROVIDER_SITE_OTHER): Payer: Medicare PPO | Admitting: Pharmacist

## 2018-09-20 DIAGNOSIS — I4891 Unspecified atrial fibrillation: Secondary | ICD-10-CM | POA: Diagnosis not present

## 2018-09-20 DIAGNOSIS — Z5181 Encounter for therapeutic drug level monitoring: Secondary | ICD-10-CM | POA: Diagnosis not present

## 2018-09-20 DIAGNOSIS — Z7901 Long term (current) use of anticoagulants: Secondary | ICD-10-CM

## 2018-09-20 LAB — POCT INR: INR: 2.6 (ref 2.0–3.0)

## 2018-09-20 NOTE — Patient Instructions (Signed)
Description   Continue same dose of Coumadin 1 tablet daily except 1/2 tablet on Sundays, Tuesdays and Thursdays Recheck in 4 weeks

## 2018-09-29 ENCOUNTER — Encounter: Payer: Self-pay | Admitting: Cardiology

## 2018-09-29 ENCOUNTER — Ambulatory Visit: Payer: Medicare PPO | Admitting: Cardiology

## 2018-09-29 VITALS — BP 118/70 | HR 88 | Ht 64.0 in | Wt 152.0 lb

## 2018-09-29 DIAGNOSIS — I480 Paroxysmal atrial fibrillation: Secondary | ICD-10-CM | POA: Diagnosis not present

## 2018-09-29 DIAGNOSIS — I10 Essential (primary) hypertension: Secondary | ICD-10-CM

## 2018-09-29 DIAGNOSIS — Z01810 Encounter for preprocedural cardiovascular examination: Secondary | ICD-10-CM | POA: Diagnosis not present

## 2018-09-29 DIAGNOSIS — E782 Mixed hyperlipidemia: Secondary | ICD-10-CM

## 2018-09-29 NOTE — Progress Notes (Signed)
Clinical Summary Jaclyn Schultz is a 85 y.o.female last seen by Dr Bronson Ing in 2014. This is our first visit together.    1. PAF - no recent palpitatons - compliant with meds. INR followed in coumadin clinic.  - she is not interested in NOACs   2. HTN - compliant with meds  3. Hyperlipidemia - compliant with statni, labs followed by pcp  4. Prior history of chest pain - cath in 2011 without singificant disease - no recent symptoms  5. Preoperative evaluation - followed by ENT at [facility] for neck mass, planned upcoming surgery - tolerates greater than 4 METs without troubles.    Past Medical History:  Diagnosis Date  . A-fib ([place])   . Cancer ([place])    mouth/dx 08-2010/surg only  . Dementia    Early  . High cholesterol   . Hypertension   . Osteoporosis   . Vertigo   . Vitamin B 12 deficiency      Allergies  Allergen Reactions  . Erythromycin Base Rash     Current Outpatient Medications  Medication Sig Dispense Refill  . Calcium Citrate-Vitamin D (CALCIUM + D PO) Take 600 mg by mouth every morning.     . cephALEXin (KEFLEX) 500 MG capsule Take 1 capsule (500 mg total) by mouth 4 (four) times daily. (Patient not taking: Reported on 01/23/2018) 28 capsule 0  . cetirizine (ZYRTEC ALLERGY) 10 MG tablet Take 10 mg by mouth every morning.     . donepezil (ARICEPT) 10 MG tablet Take 10 mg by mouth at bedtime.    . hydrochlorothiazide (HYDRODIURIL) 25 MG tablet Take 12.5 mg by mouth daily.    Marland Kitchen HYDROcodone-acetaminophen (NORCO/VICODIN) 5-325 MG tablet Take 1 tablet by mouth daily.     Marland Kitchen losartan (COZAAR) 100 MG tablet Take 1 tablet (100 mg total) by mouth daily. 90 tablet 3  . memantine (NAMENDA) 5 MG tablet Take 5 mg by mouth 2 (two) times daily.    . metoprolol tartrate (LOPRESSOR) 25 MG tablet Take 25 mg by mouth 2 (two) times daily.     . polyethylene glycol (MIRALAX / GLYCOLAX) packet Take 17 g by mouth daily. 14 each 0  . potassium chloride (KLOR-CON) 10 MEQ  CR tablet Take 10 mEq by mouth daily.     Marland Kitchen senna (SENOKOT) 8.6 MG tablet Take 1 tablet by mouth 3 (three) times a week.    . simvastatin (ZOCOR) 40 MG tablet Take 40 mg by mouth at bedtime.     Marland Kitchen trimethoprim (TRIMPEX) 100 MG tablet Take 1 tablet by mouth daily.  11  . vitamin B-12 (CYANOCOBALAMIN) 1000 MCG tablet Take 1,000 mcg by mouth daily.    Marland Kitchen warfarin (COUMADIN) 5 MG tablet Take 1 tablet daily or as directed (Patient taking differently: Take 2.5-5 mg by mouth See admin instructions. 5mg  on MWF. Take 2.5mg  on all other days) 30 tablet 3   No current facility-administered medications for this visit.      Past Surgical History:  Procedure Laterality Date  . APPENDECTOMY    . BIOPSY N/A 07/08/2015   Procedure: BIOPSY;  Surgeon: Daneil Dolin, MD;  [facility];  Service: Endoscopy;  Laterality: N/A;  possible random colon biopsies  . CARDIAC CATHETERIZATION  10/2009   normal coronary arteries  . CERVICAL DISC SURGERY    . COLONOSCOPY  05/27/2004   [id number] hemorrhoids/sigmoid diverticula  . COLONOSCOPY N/A 07/08/2015   Procedure: COLONOSCOPY;  Surgeon: Daneil Dolin, MD;  Location: [facility];  Service: Endoscopy;  Laterality: N/A;  0254  . IR KYPHO THORACIC WITH BONE BIOPSY  03/03/2017  . IR RADIOLOGIST EVAL & MGMT  03/22/2017  . IR RADIOLOGIST EVAL & MGMT  02/24/2017  . 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 MANDIBLE SURGERY    . SHOULDER SURGERY  remote     Allergies  Allergen Reactions  . Erythromycin Base Rash      Family History  Problem Relation Age of Onset  . Colon cancer Sister        age 51     Social History Ms. Schultz reports that she has never smoked. She quit smokeless tobacco use about 5 years ago.  Her smokeless tobacco use included chew. Jaclyn Schultz reports no history of alcohol use.   Review of Systems CONSTITUTIONAL: No weight loss, fever, chills, weakness or fatigue.  HEENT: Eyes: No visual loss, blurred vision,  double vision or yellow sclerae.No hearing loss, sneezing, congestion, runny nose or sore throat.  SKIN: No rash or itching.  CARDIOVASCULAR: per hpi RESPIRATORY: No shortness of breath, cough or sputum.  GASTROINTESTINAL: No anorexia, nausea, vomiting or diarrhea. No abdominal pain or blood.  GENITOURINARY: No burning on urination, no polyuria NEUROLOGICAL: No headache, dizziness, syncope, paralysis, ataxia, numbness or tingling in the extremities. No change in bowel or bladder control.  MUSCULOSKELETAL: No muscle, back pain, joint pain or stiffness.  LYMPHATICS: No enlarged nodes. No history of splenectomy.  PSYCHIATRIC: No history of depression or anxiety.  ENDOCRINOLOGIC: No reports of sweating, cold or heat intolerance. No polyuria or polydipsia.  Marland Kitchen   Physical Examination Today's Vitals   09/29/18 1059 09/29/18 1105  BP: 122/78 118/70  Pulse: 88   SpO2: 98%   Weight: 152 lb (68.9 kg)   Height: 5\' 4"  (1.626 m)    Body mass index is 26.09 kg/m.  Gen: resting comfortably, no acute distress HEENT: no scleral icterus, pupils equal round and reactive, no palptable cervical adenopathy,  CV: RRR, no m/r/,g no jvd Resp: Clear to auscultation bilaterally GI: abdomen is soft, non-tender, non-distended, normal bowel sounds, no hepatosplenomegaly MSK: extremities are warm, no edema.  Skin: warm, no rash Neuro:  no focal deficits Psych: appropriate affect   Diagnostic Studies 2011 cath: normal coronaries    Assessment and Plan  1. PAF - no recent symptoms, continue current meds - not intersted in NOACs, continue coumadin - EKG today shows SR  2. HTN - at goal, continue current meds  3. Hyperlipidemia - request labs from pcp   4. Preoperative evaluation - being considered for head and neck surgery for neck mass - no contraindications from cardiac standpoint - will contact coumadin clinic to help coordinate coming off her coumadin. No indication for lovenox bridging.  Surgery is planned for Oct 10, 2018   F/u Dr Bronson Ing in 1 year, previously seen by him  Arnoldo Lenis, M.D.

## 2018-09-29 NOTE — Patient Instructions (Signed)
Medication Instructions:  Your physician recommends that you continue on your current medications as directed. Please refer to the Current Medication list given to you today.  If you need a refill on your cardiac medications before your next appointment, please call your pharmacy.   Lab work: NONE   If you have labs (blood work) drawn today and your tests are completely normal, you will receive your results only by: . MyChart Message (if you have MyChart) OR . A paper copy in the mail If you have any lab test that is abnormal or we need to change your treatment, we will call you to review the results.  Testing/Procedures: NONE   Follow-Up: At [facility], you and your health needs are our priority.  As part of our continuing mission to provide you with exceptional heart care, we have created designated Provider Care Teams.  These Care Teams include your primary Cardiologist (physician) and Advanced Practice Providers (APPs -  Physician Assistants and Nurse Practitioners) who all work together to provide you with the care you need, when you need it. You will need a follow up appointment in 6 months.  Please call our office 2 months in advance to schedule this appointment.  You may see Branch, Jonathan, MD or one of the following Advanced Practice Providers on your designated Care Team:   Brittany Strader, PA-C ([place] Office) . Michele Lenze, PA-C ([place] Office)  Any Other Special Instructions Will Be Listed Below (If Applicable). Thank you for choosing [facility]!     

## 2018-10-02 ENCOUNTER — Telehealth: Payer: Self-pay | Admitting: Pharmacist

## 2018-10-02 NOTE — Telephone Encounter (Signed)
Called pt to discuss coming off anticoagulation prior to procedure. Spoke with patient's daughter who reports that procedure is scheduled for 2/13 (preop is 2/11). Last dose of warfarin will need to be 10/06/18. Provided instructions for holding. Advised to resume at direction of surgeon with usual dose. Rescheduled INR check for 2 weeks post op. She states understanding.

## 2018-10-02 NOTE — Telephone Encounter (Signed)
-----   Message from Arnoldo Lenis, MD sent at 09/29/2018 11:48 AM EST ----- Patient of ours with upcoming neck surgery, can you help coordinate coming off her coumadin. Surgery is planned for Feb 11. DOes not need bridge, just needs to stop prior and repeat INR check   Jaclyn Abts MD

## 2018-11-08 ENCOUNTER — Other Ambulatory Visit: Payer: Self-pay

## 2018-11-08 ENCOUNTER — Ambulatory Visit (INDEPENDENT_AMBULATORY_CARE_PROVIDER_SITE_OTHER): Payer: Medicare PPO | Admitting: *Deleted

## 2018-11-08 DIAGNOSIS — I4891 Unspecified atrial fibrillation: Secondary | ICD-10-CM

## 2018-11-08 DIAGNOSIS — Z5181 Encounter for therapeutic drug level monitoring: Secondary | ICD-10-CM

## 2018-11-08 LAB — POCT INR: INR: 3.9 — AB (ref 2.0–3.0)

## 2018-11-08 NOTE — Patient Instructions (Addendum)
Took coumadin this morning.  Hold coumadin tomorrow, take 1/2 tablet on Friday then resume 1 tablet daily except 1/2 tablet on Sundays, Tuesdays and Thursdays Recheck in 3 weeks

## 2018-11-28 ENCOUNTER — Telehealth: Payer: Self-pay | Admitting: *Deleted

## 2018-11-28 NOTE — Telephone Encounter (Signed)
LMOM to call for pre reg and screening questions/tg

## 2018-12-06 ENCOUNTER — Telehealth: Payer: Self-pay | Admitting: Cardiology

## 2018-12-06 NOTE — Telephone Encounter (Signed)
° ° ° °  COVID-19 Pre-Screening Questions: ° °• Do you currently have a fever? No °•  °• Have you recently travelled on a cruise, internationally, or to [place], [place], [place], [place], [place], or [place], [place] ([employer]) ? No °•  °• Have you been in contact with someone that is currently pending confirmation of Covid19 testing or has been confirmed to have the Covid19 virus? No °•  °• Are you currently experiencing fatigue or cough? No  ° ° °   ° ° ° ° ° °

## 2018-12-07 ENCOUNTER — Ambulatory Visit (INDEPENDENT_AMBULATORY_CARE_PROVIDER_SITE_OTHER): Payer: Medicare PPO | Admitting: *Deleted

## 2018-12-07 DIAGNOSIS — I4891 Unspecified atrial fibrillation: Secondary | ICD-10-CM

## 2018-12-07 DIAGNOSIS — Z5181 Encounter for therapeutic drug level monitoring: Secondary | ICD-10-CM

## 2018-12-07 LAB — POCT INR: INR: 3.3 — AB (ref 2.0–3.0)

## 2018-12-07 NOTE — Patient Instructions (Signed)
Hold coumadin today then decrease dose to 1/2 tablet daily except 1 tablet on Mondays, Wednesdays and Fridays Recheck in 4 weeks

## 2019-03-01 ENCOUNTER — Other Ambulatory Visit: Payer: Self-pay

## 2019-03-01 ENCOUNTER — Ambulatory Visit (INDEPENDENT_AMBULATORY_CARE_PROVIDER_SITE_OTHER): Payer: Medicare PPO | Admitting: *Deleted

## 2019-03-01 DIAGNOSIS — Z5181 Encounter for therapeutic drug level monitoring: Secondary | ICD-10-CM | POA: Diagnosis not present

## 2019-03-01 DIAGNOSIS — I4891 Unspecified atrial fibrillation: Secondary | ICD-10-CM

## 2019-03-01 LAB — POCT INR: INR: 3.4 — AB (ref 2.0–3.0)

## 2019-03-01 NOTE — Patient Instructions (Signed)
Hold coumadin today then decrease dose to 1/2 tablet daily except 1 tablet on Mondays and Thursdays Recheck in 4 weeks

## 2019-04-02 ENCOUNTER — Encounter: Payer: Self-pay | Admitting: Cardiovascular Disease

## 2019-04-02 ENCOUNTER — Telehealth (INDEPENDENT_AMBULATORY_CARE_PROVIDER_SITE_OTHER): Payer: Medicare PPO | Admitting: Cardiovascular Disease

## 2019-04-02 VITALS — BP 128/72 | HR 60 | Ht 61.0 in | Wt 142.0 lb

## 2019-04-02 DIAGNOSIS — R0609 Other forms of dyspnea: Secondary | ICD-10-CM | POA: Diagnosis not present

## 2019-04-02 DIAGNOSIS — Z7901 Long term (current) use of anticoagulants: Secondary | ICD-10-CM

## 2019-04-02 DIAGNOSIS — E782 Mixed hyperlipidemia: Secondary | ICD-10-CM

## 2019-04-02 DIAGNOSIS — I480 Paroxysmal atrial fibrillation: Secondary | ICD-10-CM

## 2019-04-02 DIAGNOSIS — I10 Essential (primary) hypertension: Secondary | ICD-10-CM

## 2019-04-02 NOTE — Patient Instructions (Signed)
Medication Instructions: Your physician recommends that you continue on your current medications as directed. Please refer to the Current Medication list given to you today.   Labwork: none  Procedures/Testing: Your physician has requested that you have a lexiscan myoview. For further information please visit [email or web address]. Please follow instruction sheet, as given.    Follow-Up: 2 months phone or virtual visit with Dr.Koneswaran  Any Additional Special Instructions Will Be Listed Below (If Applicable).     If you need a refill on your cardiac medications before your next appointment, please call your pharmacy.      Thank you for choosing [facility] !

## 2019-04-02 NOTE — Progress Notes (Signed)
Virtual Visit via Telephone Note   This visit type was conducted due to national recommendations for restrictions regarding the COVID-19 Pandemic (e.g. social distancing) in an effort to limit this patient's exposure and mitigate transmission in our community.  Due to her co-morbid illnesses, this patient is at least at moderate risk for complications without adequate follow up.  This format is felt to be most appropriate for this patient at this time.  The patient did not have access to video technology/had technical difficulties with video requiring transitioning to audio format only (telephone).  All issues noted in this document were discussed and addressed.  No physical exam could be performed with this format.  Please refer to the patient's chart for her  consent to telehealth for [facility].   Date:  04/02/2019   ID:  Jaclyn Schultz, DOB 07/31/1934, MRN [id number]  Patient Location: Home Provider Location: Office  PCP:  Lemmie Evens, MD  Cardiologist:  Kate Sable, MD  Electrophysiologist:  None   Evaluation Performed:  Follow-Up Visit  Chief Complaint: Paroxysmal atrial fibrillation  History of Present Illness:    Jaclyn Schultz is a 85 y.o. female with paroxysmal atrial fibrillation.  She is anticoagulated with warfarin as she was not interested in alternative forms of anticoagulation.  I have seen her only once before in 2014.  She saw Dr. Harl Bowie in January 2020.  She also has a prior history of chest pain with a cardiac catheterization in 2011 demonstrating no significant disease.  Denies fevers. I also spoke with her granddaughter who says the patient is short of breath when walking to the car and even making the bed. This has been going on for about 2 months. She denies chest pain.  She never smoked but did dip snuff several years ago.  She denies having a cough.  Past Medical History:  Diagnosis Date  . A-fib ([place])   . Cancer ([place])    mouth/dx 08-2010/surg  only  . Dementia ([place])    Early  . High cholesterol   . Hypertension   . Osteoporosis   . Vertigo   . Vitamin B 12 deficiency    Past Surgical History:  Procedure Laterality Date  . APPENDECTOMY    . BIOPSY N/A 07/08/2015   Procedure: BIOPSY;  Surgeon: Daneil Dolin, MD;  [facility];  Service: Endoscopy;  Laterality: N/A;  possible random colon biopsies  . CARDIAC CATHETERIZATION  10/2009   normal coronary arteries  . CERVICAL DISC SURGERY    . COLONOSCOPY  05/27/2004   [id number] hemorrhoids/sigmoid diverticula  . COLONOSCOPY N/A 07/08/2015   Procedure: COLONOSCOPY;  Surgeon: Daneil Dolin, MD;  [facility];  Service: Endoscopy;  Laterality: N/A;  6256  . IR KYPHO THORACIC WITH BONE BIOPSY  03/03/2017  . IR RADIOLOGIST EVAL & MGMT  03/22/2017  . IR RADIOLOGIST EVAL & MGMT  02/24/2017  . 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 MANDIBLE SURGERY    . SHOULDER SURGERY  remote     Current Meds  Medication Sig  . Calcium Citrate-Vitamin D (CALCIUM + D PO) Take 600 mg by mouth every morning.   . cetirizine (ZYRTEC ALLERGY) 10 MG tablet Take 10 mg by mouth every morning.   . donepezil (ARICEPT) 10 MG tablet Take 10 mg by mouth at bedtime.  . hydrochlorothiazide (HYDRODIURIL) 25 MG tablet Take 12.5 mg by mouth daily.  Marland Kitchen HYDROcodone-acetaminophen (NORCO/VICODIN) 5-325 MG tablet  Take 1 tablet by mouth daily.   Marland Kitchen losartan (COZAAR) 100 MG tablet Take 1 tablet (100 mg total) by mouth daily.  . memantine (NAMENDA) 5 MG tablet Take 5 mg by mouth 2 (two) times daily.  . metoprolol tartrate (LOPRESSOR) 25 MG tablet Take 25 mg by mouth 2 (two) times daily.   . polyethylene glycol (MIRALAX / GLYCOLAX) packet Take 17 g by mouth daily.  . potassium chloride (KLOR-CON) 10 MEQ CR tablet Take 10 mEq by mouth daily.   Marland Kitchen senna (SENOKOT) 8.6 MG tablet Take 1 tablet by mouth 3 (three) times a week.  . simvastatin (ZOCOR) 40 MG tablet Take 40 mg by mouth at bedtime.   Marland Kitchen  trimethoprim (TRIMPEX) 100 MG tablet Take 1 tablet by mouth daily.  . vitamin B-12 (CYANOCOBALAMIN) 1000 MCG tablet Take 1,000 mcg by mouth daily.  Marland Kitchen warfarin (COUMADIN) 5 MG tablet Take 1 tablet daily or as directed (Patient taking differently: Take 2.5-5 mg by mouth See admin instructions. 5mg  on MWF. Take 2.5mg  on all other days)     Allergies:   Erythromycin base   Social History   Tobacco Use  . Smoking status: Never Smoker  . Smokeless tobacco: Former Systems developer    Types: Chew  . Tobacco comment: Never smoked  Substance Use Topics  . Alcohol use: No    Alcohol/week: 0.0 standard drinks  . Drug use: No     Family Hx: The patient's family history includes Colon cancer in her sister.  ROS:   Please see the history of present illness.     All other systems reviewed and are negative.   Prior CV studies:   The following studies were reviewed today:  See above  Labs/Other Tests and Data Reviewed:    EKG:  No ECG reviewed.  Recent Labs: 08/07/2018: ALT 20; BUN 21; Creatinine, Ser 1.24; Hemoglobin 14.5; Platelets 188; Potassium 4.2; Sodium 140   Recent Lipid Panel Lab Results  Component Value Date/Time   CHOL 149 08/07/2018 10:20 AM   TRIG 102 08/07/2018 10:20 AM   HDL 53 08/07/2018 10:20 AM   CHOLHDL 2.8 08/07/2018 10:20 AM   LDLCALC 76 08/07/2018 10:20 AM    Wt Readings from Last 3 Encounters:  04/02/19 142 lb (64.4 kg)  09/29/18 152 lb (68.9 kg)  01/23/18 163 lb (73.9 kg)    Chest xray December 2019:  IMPRESSION: Mild chronic bronchitic changes, stable. No evidence of CHF, pneumonia, nor malignancy.  Thoracic aortic atherosclerosis.  Objective:    Vital Signs:  BP 128/72   Pulse 60   Ht 5\' 1"  (1.549 m)   Wt 142 lb (64.4 kg)   BMI 26.83 kg/m    VITAL SIGNS:  reviewed  ASSESSMENT & PLAN:    1.  Paroxysmal atrial fibrillation: Symptomatically stable.  Continue Lopressor.  Continue warfarin as she is not interested in alternative forms of  anticoagulation.  2. Hypertension: Blood pressure is normal.  No changes.  3. Hyperlipidemia: Lipids reviewed above. Continue simvastatin.  4. Exertional dyspnea: No known history of pulmonary disease.  Symptoms have been ongoing for 2 months.  No cardiac murmur on physical exam to suggest valvular heart disease as per Dr. Nelly Laurence office note in January 2020. I will proceed with a nuclear myocardial perfusion imaging study to evaluate for ischemic heart disease (Lexiscan Myoview).    COVID-19 Education: The signs and symptoms of COVID-19 were discussed with the patient and how to seek care for testing (follow up with PCP  or arrange E-visit).  The importance of social distancing was discussed today.  Time:   Today, I have spent 15 minutes with the patient with telehealth technology discussing the above problems.     Medication Adjustments/Labs and Tests Ordered: Current medicines are reviewed at length with the patient today.  Concerns regarding medicines are outlined above.   Tests Ordered: No orders of the defined types were placed in this encounter.   Medication Changes: No orders of the defined types were placed in this encounter.   Follow Up:  Virtual Visit in 2 month(s)  Signed, Kate Sable, MD  04/02/2019 12:25 PM    [place]

## 2019-04-02 NOTE — Addendum Note (Signed)
Addended by: Barbarann Ehlers A on: 04/02/2019 12:44 PM   Modules accepted: Orders

## 2019-04-04 ENCOUNTER — Other Ambulatory Visit: Payer: Self-pay

## 2019-04-04 ENCOUNTER — Ambulatory Visit (INDEPENDENT_AMBULATORY_CARE_PROVIDER_SITE_OTHER): Payer: Medicare PPO | Admitting: *Deleted

## 2019-04-04 DIAGNOSIS — I480 Paroxysmal atrial fibrillation: Secondary | ICD-10-CM

## 2019-04-04 DIAGNOSIS — Z5181 Encounter for therapeutic drug level monitoring: Secondary | ICD-10-CM | POA: Diagnosis not present

## 2019-04-04 LAB — POCT INR: INR: 2.1 (ref 2.0–3.0)

## 2019-04-04 NOTE — Patient Instructions (Signed)
Continue coumadin 1/2 tablet daily except 1 tablet on Mondays and Thursdays Recheck in 4 weeks   

## 2019-04-09 ENCOUNTER — Encounter (HOSPITAL_COMMUNITY): Payer: Medicare PPO

## 2019-04-17 ENCOUNTER — Encounter (HOSPITAL_BASED_OUTPATIENT_CLINIC_OR_DEPARTMENT_OTHER)
Admission: RE | Admit: 2019-04-17 | Discharge: 2019-04-17 | Disposition: A | Discharge status: Home / Self Care | Payer: Medicare PPO | Source: Ambulatory Visit | Attending: Cardiovascular Disease | Admitting: Cardiovascular Disease

## 2019-04-17 ENCOUNTER — Encounter (HOSPITAL_COMMUNITY)
Admission: RE | Admit: 2019-04-17 | Discharge: 2019-04-17 | Disposition: A | Discharge status: Home / Self Care | Payer: Medicare PPO | Source: Ambulatory Visit | Attending: Cardiovascular Disease | Admitting: Cardiovascular Disease

## 2019-04-17 ENCOUNTER — Other Ambulatory Visit: Payer: Self-pay

## 2019-04-17 DIAGNOSIS — R0609 Other forms of dyspnea: Secondary | ICD-10-CM | POA: Diagnosis present

## 2019-04-17 LAB — NM MYOCAR MULTI W/SPECT W/WALL MOTION / EF
LV dias vol: 43 mL (ref 46–106)
LV sys vol: 19 mL
Peak HR: 88 {beats}/min
RATE: 0.43
Rest HR: 72 {beats}/min
SDS: 6
SRS: 5
SSS: 11
TID: 1.16

## 2019-04-17 MED ORDER — SODIUM CHLORIDE 0.9% FLUSH
INTRAVENOUS | Status: AC
  Administered 2019-04-17: 10 mL via INTRAVENOUS
  Filled 2019-04-17: qty 10

## 2019-04-17 MED ORDER — TECHNETIUM TC 99M TETROFOSMIN IV KIT
10.0000 | PACK | Freq: Once | INTRAVENOUS | Status: AC | PRN
  Administered 2019-04-17: 9.6 via INTRAVENOUS

## 2019-04-17 MED ORDER — REGADENOSON 0.4 MG/5ML IV SOLN
INTRAVENOUS | Status: AC
  Administered 2019-04-17: 0.4 mg via INTRAVENOUS
  Filled 2019-04-17: qty 5

## 2019-04-17 MED ORDER — TECHNETIUM TC 99M TETROFOSMIN IV KIT
30.0000 | PACK | Freq: Once | INTRAVENOUS | Status: AC | PRN
  Administered 2019-04-17: 33 via INTRAVENOUS

## 2019-05-02 ENCOUNTER — Ambulatory Visit (INDEPENDENT_AMBULATORY_CARE_PROVIDER_SITE_OTHER): Payer: Medicare PPO | Admitting: *Deleted

## 2019-05-02 ENCOUNTER — Other Ambulatory Visit: Payer: Self-pay

## 2019-05-02 DIAGNOSIS — Z5181 Encounter for therapeutic drug level monitoring: Secondary | ICD-10-CM

## 2019-05-02 DIAGNOSIS — I480 Paroxysmal atrial fibrillation: Secondary | ICD-10-CM

## 2019-05-02 LAB — POCT INR: INR: 2.5 (ref 2.0–3.0)

## 2019-05-02 NOTE — Patient Instructions (Signed)
Continue coumadin 1/2 tablet daily except 1 tablet on Mondays and Thursdays Recheck in 5 weeks

## 2019-05-03 ENCOUNTER — Encounter (HOSPITAL_COMMUNITY): Payer: Self-pay

## 2019-05-03 ENCOUNTER — Other Ambulatory Visit: Payer: Self-pay

## 2019-05-03 ENCOUNTER — Emergency Department (HOSPITAL_COMMUNITY): Payer: Medicare PPO

## 2019-05-03 ENCOUNTER — Emergency Department (HOSPITAL_COMMUNITY)
Admission: EM | Admit: 2019-05-03 | Discharge: 2019-05-03 | Disposition: A | Discharge status: Home / Self Care | Payer: Medicare PPO | Attending: Emergency Medicine | Admitting: Emergency Medicine

## 2019-05-03 DIAGNOSIS — K5900 Constipation, unspecified: Secondary | ICD-10-CM

## 2019-05-03 DIAGNOSIS — E860 Dehydration: Secondary | ICD-10-CM

## 2019-05-03 DIAGNOSIS — I10 Essential (primary) hypertension: Secondary | ICD-10-CM | POA: Insufficient documentation

## 2019-05-03 DIAGNOSIS — Z79899 Other long term (current) drug therapy: Secondary | ICD-10-CM | POA: Diagnosis not present

## 2019-05-03 DIAGNOSIS — M545 Low back pain, unspecified: Secondary | ICD-10-CM

## 2019-05-03 LAB — URINALYSIS, MICROSCOPIC (REFLEX)

## 2019-05-03 LAB — URINALYSIS, ROUTINE W REFLEX MICROSCOPIC
Glucose, UA: NEGATIVE mg/dL
Ketones, ur: >80 mg/dL — AB
Leukocytes,Ua: NEGATIVE
Nitrite: NEGATIVE
Protein, ur: NEGATIVE mg/dL
Specific Gravity, Urine: >1.03 — ABNORMAL HIGH (ref 1.005–1.030)
pH: 5.5 (ref 5.0–8.0)

## 2019-05-03 MED ORDER — MAGNESIUM HYDROXIDE 400 MG/5ML PO SUSP
15.0000 mL | Freq: Once | ORAL | Status: AC
  Administered 2019-05-03: 17:00:00 15 mL via ORAL
  Filled 2019-05-03: qty 30

## 2019-05-03 MED ORDER — SODIUM CHLORIDE 0.9 % IV SOLN: 1000.0000 mL | INTRAVENOUS | Status: DC

## 2019-05-03 MED ORDER — CEPHALEXIN 500 MG PO CAPS
500.0000 mg | ORAL_CAPSULE | Freq: Once | ORAL | Status: AC
  Administered 2019-05-03: 500 mg via ORAL
  Filled 2019-05-03: qty 1

## 2019-05-03 MED ORDER — PREDNISONE 20 MG PO TABS
40.0000 mg | ORAL_TABLET | Freq: Once | ORAL | Status: AC
  Administered 2019-05-03: 17:00:00 40 mg via ORAL
  Filled 2019-05-03: qty 2

## 2019-05-03 MED ORDER — HYDROCODONE-ACETAMINOPHEN 5-325 MG PO TABS
1.0000 | ORAL_TABLET | Freq: Once | ORAL | Status: AC
  Administered 2019-05-03: 1 via ORAL
  Filled 2019-05-03: qty 1

## 2019-05-03 MED ORDER — SODIUM CHLORIDE 0.9 % IV BOLUS (SEPSIS)
500.0000 mL | Freq: Once | INTRAVENOUS | Status: AC
  Administered 2019-05-03: 17:00:00 500 mL via INTRAVENOUS

## 2019-05-03 NOTE — Discharge Instructions (Addendum)
Your urine test suggest that you are not getting quite enough fluids in.  Please increase fluids.  Chocolate pudding may be helpful along with a Fleets mineral oil enema daily for three days, to help with constipation.  Please use Keflex 4 times daily for possible urinary tract infection.  Please see Dr. Charlett Blake, or return to the emergency department if any worsening of your symptoms, high fever, nausea/vomiting, changes in your condition, problems, or concerns.

## 2019-05-03 NOTE — ED Triage Notes (Signed)
Pt's granddaughter reports pt c/o constipation today.  Pt has alzheimer's and unsure when last bm was.  Pt c/o pain in lower back.

## 2019-05-03 NOTE — ED Provider Notes (Signed)
[facility] EMERGENCY DEPARTMENT Provider Note   CSN: [id number] Arrival date & time: 05/03/19  1418     History   Chief Complaint Chief Complaint  Patient presents with  . Back Pain  . Constipation    HPI Jaclyn Schultz is a 85 y.o. female.     Patient is an 85 year old female who presents to the emergency department with a complaint of constipation and back pain.  The patient's granddaughter is the primary historian, as the patient has some problems with Alzheimer's disease.  The patient is unsure of the last bowel movement.  The granddaughter thinks that it may have been within the last 3 days.  The patient has not had fever to be reported.  The patient does complain of some lower back pain.  The granddaughter thinks that the patient had a minor fall a few days ago, and has been complaining of her lower back since that time.  The granddaughter also reports that the patient's confusion seems to be a little worse.  And she would also like to have this evaluated.     Past Medical History:  Diagnosis Date  . A-fib ([place])   . Cancer ([place])    mouth/dx 08-2010/surg only  . Dementia ([place])    Early  . High cholesterol   . Hypertension   . Osteoporosis   . Vertigo   . Vitamin B 12 deficiency     Patient Active Problem List   Diagnosis Date Noted  . 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 Esophageal abnormality 02/03/2016  . Alzheimer's dementia without behavioral disturbance ([place]) 02/03/2016  . Primary squamous cell carcinoma of head and neck ([place]) 01/30/2016  . Diarrhea 06/30/2015  . FH: colon cancer 06/30/2015  . Foot fracture, left 10/09/2013  . Encounter for therapeutic drug monitoring 09/26/2013  . Metatarsal fracture 08/14/2013  . Bronchitis, acute 07/12/2013  . SOB (shortness of breath) 07/12/2013  . 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 Chronic periodontitis, unspecified 03/22/2013  . Allergic  rhinitis due to pollen 03/22/2013  . 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 Malignant neoplasm of head, face, and neck ([place]) 03/22/2013  . Other B-complex deficiencies 03/22/2013  . Mixed hyperlipidemia 03/22/2013  . Essential hypertension, benign 03/22/2013  . Reflux esophagitis 03/22/2013  . Senile osteoporosis 03/22/2013  . Atrial fibrillation, chronic 11/05/2012  . Long term (current) use of anticoagulants 11/05/2012    Past Surgical History:  Procedure Laterality Date  . APPENDECTOMY    . BIOPSY N/A 07/08/2015   Procedure: BIOPSY;  Surgeon: Daneil Dolin, MD;  Location: [facility] ENDO SUITE;  Service: Endoscopy;  Laterality: N/A;  possible random colon biopsies  . CARDIAC CATHETERIZATION  10/2009   normal coronary arteries  . CERVICAL DISC SURGERY    . COLONOSCOPY  05/27/2004   [id number] hemorrhoids/sigmoid diverticula  . COLONOSCOPY N/A 07/08/2015   Procedure: COLONOSCOPY;  Surgeon: Daneil Dolin, MD;  Location: [facility] ENDO SUITE;  Service: Endoscopy;  Laterality: [id number]  . IR KYPHO THORACIC WITH BONE BIOPSY  03/03/2017  . IR RADIOLOGIST EVAL & MGMT  03/22/2017  . IR RADIOLOGIST EVAL & MGMT  02/24/2017  . 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 MANDIBLE SURGERY    . SHOULDER SURGERY  remote     OB History    Gravida      Para      Term  Preterm      AB      Living  4     SAB      TAB      Ectopic      Multiple      Live Births               Home Medications    Prior to Admission medications   Medication Sig Start Date End Date Taking? Authorizing Provider  Calcium Citrate-Vitamin D (CALCIUM + D PO) Take 600 mg by mouth every morning.     [provider]  cetirizine (ZYRTEC ALLERGY) 10 MG tablet Take 10 mg by mouth every morning.     [provider]  donepezil (ARICEPT) 10 MG tablet Take 10 mg by mouth at bedtime. 09/06/17   [provider]  hydrochlorothiazide (HYDRODIURIL) 25  MG tablet Take 12.5 mg by mouth daily.    [provider]  HYDROcodone-acetaminophen (NORCO/VICODIN) 5-325 MG tablet Take 1 tablet by mouth daily.     [provider]  losartan (COZAAR) 100 MG tablet Take 1 tablet (100 mg total) by mouth daily. 03/23/13   Herminio Commons, MD  memantine (NAMENDA) 5 MG tablet Take 5 mg by mouth 2 (two) times daily. 09/06/17   [provider]  metoprolol tartrate (LOPRESSOR) 25 MG tablet Take 25 mg by mouth 2 (two) times daily.     [provider]  polyethylene glycol (MIRALAX / GLYCOLAX) packet Take 17 g by mouth daily. 01/23/18   Evalee Jefferson, PA-C  potassium chloride (KLOR-CON) 10 MEQ CR tablet Take 10 mEq by mouth daily.     [provider]  senna (SENOKOT) 8.6 MG tablet Take 1 tablet by mouth 3 (three) times a week.    [provider]  simvastatin (ZOCOR) 40 MG tablet Take 40 mg by mouth at bedtime.     [provider]  trimethoprim (TRIMPEX) 100 MG tablet Take 1 tablet by mouth daily. 05/14/15   [provider]  vitamin B-12 (CYANOCOBALAMIN) 1000 MCG tablet Take 1,000 mcg by mouth daily.    [provider]  warfarin (COUMADIN) 5 MG tablet Take 1 tablet daily or as directed Patient taking differently: Take 2.5-5 mg by mouth See admin instructions. 5mg  on MWF. Take 2.5mg  on all other days 09/10/14   Herminio Commons, MD    Family History Family History  Problem Relation Age of Onset  . Colon cancer Sister        age 33    Social History Social History   Tobacco Use  . Smoking status: Never Smoker  . Smokeless tobacco: Former Systems developer    Types: Chew  . Tobacco comment: Never smoked  Substance Use Topics  . Alcohol use: No    Alcohol/week: 0.0 standard drinks  . Drug use: No     Allergies   Erythromycin base   Review of Systems Review of Systems  Constitutional: Negative for activity change.       All ROS Neg except as noted in HPI  HENT: Negative.   Eyes:  Negative for photophobia and discharge.  Respiratory: Negative for cough, shortness of breath and wheezing.   Cardiovascular: Negative for chest pain and palpitations.  Gastrointestinal: Positive for constipation. Negative for abdominal pain and blood in stool.  Genitourinary: Negative for dysuria, frequency and hematuria.  Musculoskeletal: Positive for back pain. Negative for arthralgias and neck pain.  Skin: Negative.   Neurological: Negative for dizziness, seizures and speech difficulty.  Psychiatric/Behavioral: Positive for confusion. Negative for hallucinations.     Physical Exam Updated Vital Signs BP (!) 139/111 (BP Location: Right Arm)   Pulse 89   Temp 98.4 F (36.9 C) (Oral)   Resp 18   Ht 5\' 3"  (1.6 m)   Wt 61.7 kg   SpO2 98%   BMI 24.09 kg/m   Physical Exam Vitals signs and nursing note reviewed.  Constitutional:      Appearance: She is well-developed. She is not toxic-appearing.  HENT:     Head: Normocephalic.     Right Ear: Tympanic membrane and external ear normal.     Left Ear: Tympanic membrane and external ear normal.  Eyes:     General: Lids are normal.     Pupils: Pupils are equal, round, and reactive to light.  Neck:     Musculoskeletal: Normal range of motion and neck supple.     Vascular: No carotid bruit.  Cardiovascular:     Rate and Rhythm: Normal rate and regular rhythm.     Pulses: Normal pulses.     Heart sounds: Normal heart sounds.  Pulmonary:     Effort: No respiratory distress.     Breath sounds: Normal breath sounds.  Abdominal:     General: Bowel sounds are normal.     Palpations: Abdomen is soft.     Tenderness: There is no abdominal tenderness. There is no guarding.  Musculoskeletal: Normal range of motion.     Lumbar back: She exhibits tenderness.  Lymphadenopathy:     Head:     Right side of head: No submandibular adenopathy.     Left side of head: No submandibular adenopathy.     Cervical: No cervical adenopathy.  Skin:     General: Skin is warm and dry.  Neurological:     Mental Status: She is alert and oriented to person, place, and time.     Cranial Nerves: No cranial nerve deficit.     Sensory: No sensory deficit.  Psychiatric:        Mood and Affect: Mood normal.        Speech: Speech normal.      ED Treatments / Results  Labs (all labs ordered are listed, but only abnormal results are displayed) Labs Reviewed  URINALYSIS, ROUTINE W REFLEX MICROSCOPIC - Abnormal; Notable for the following components:      Result Value   APPearance HAZY (*)    Specific Gravity, Urine >1.030 (*)    Hgb urine dipstick SMALL (*)    Bilirubin Urine SMALL (*)    Ketones, ur >80 (*)    All other components within normal limits  URINALYSIS, MICROSCOPIC (REFLEX) - Abnormal; Notable for the following components:   Bacteria, UA MANY (*)    All other components within normal limits  URINE CULTURE    EKG None  Radiology Dg Chest 2 View  Result Date: 05/03/2019 CLINICAL DATA:  Back pain. EXAM: CHEST - 2 VIEW COMPARISON:  Radiographs of August 07, 2018. FINDINGS: The heart size and mediastinal contours are within normal limits. Both lungs are clear. The visualized skeletal structures are unremarkable. IMPRESSION: No active cardiopulmonary disease. Electronically Signed   By: Marijo Conception M.D.   On: 05/03/2019 16:17   Dg Lumbar Spine Complete  Result Date: 05/03/2019 CLINICAL DATA:  Lower back pain. EXAM: LUMBAR SPINE - COMPLETE 4+ VIEW COMPARISON:  Radiographs of February 22, 2017. FINDINGS: Status post T12 kyphoplasty. No acute fracture or spondylolisthesis is noted.  Moderate degenerative disc disease is noted at L2-3. Mild degenerative disc disease is noted at L3-4 with anterior osteophyte formation. IMPRESSION: Mild-to-moderate multilevel degenerative disc disease. Status post T12 kyphoplasty. No acute abnormality seen in the lumbar spine. Electronically Signed   By: Marijo Conception M.D.   On: 05/03/2019 16:14     Procedures Procedures (including critical care time)  Medications Ordered in ED Medications  predniSONE (DELTASONE) tablet 40 mg (has no administration in time range)  HYDROcodone-acetaminophen (NORCO/VICODIN) 5-325 MG per tablet 1 tablet (has no administration in time range)  cephALEXin (KEFLEX) capsule 500 mg (has no administration in time range)  magnesium hydroxide (MILK OF MAGNESIA) suspension 15 mL (has no administration in time range)     Initial Impression / Assessment and Plan / ED Course  I have reviewed the triage vital signs and the nursing notes.  Pertinent labs & imaging results that were available during my care of the patient were reviewed by me and considered in my medical decision making (see chart for details).  Clinical Course as of May 04 1958  Thu May 03, 2019  [address] Pleasant female brought in by family members for evaluation of back pain.  She has had prior back issues and has kyphoplasty.  No reported trauma.  Her x-rays do not show any acute fractures but did show a lot of DJD.  She received some medications here and is feeling somewhat better.  Possible UTI.  Will cover with antibiotics and close follow-up with PCP.   [MB]    Clinical Course User Index [MB] Hayden Rasmussen, MD         Final Clinical Impressions(s) / ED Diagnoses MDM  Patient seen with me by Dr. Melina Copa.  The patient is complaining of back pain and constipation.  There is question of a possible fall a few days ago, as the patient has been complaining of pain since that time.  Patient was given medication for discomfort.  Patient was also given medication for constipation here in the emergency department.  X-ray of the lumbar spine shows mild to moderate multilevel degenerative disc disease.  There is also a T12 kyphoplasty noted.  No acute abnormality is seen at this time. Chest x-ray shows no active cardiopulmonary disease.  Urine analysis shows a hazy specimen with a specific  gravity of greater than 1.030.  There is a small amount of blood on the dipstick.  The leukocyte esterase and nitrites are negative, there is greater than 80 mg/daL of ketones present.  The microscopic shows 11-20 white blood cells and many bacteria.  A culture of the urine has been sent to the lab.  The patient will be treated with Keflex for the next 5 days. I have asked the family to use fleets mineral oil enemas over the next 3 days.  I have also asked them to use a cup of Jell-O chocolate pudding orally daily until the situation has resolved.  The patient is to use Tylenol extra strength every 4 hours if needed for pain or discomfort.  Patient is to follow-up with the primary physician concerning the possible urinary tract infection.  The of patient is to return to the emergency department if any high fever, nausea/vomiting, worsening of pain, changes in condition, problems, or concerns.  Patient's family is in agreement with this plan.   Final diagnoses:  Constipation, unspecified constipation type  Dehydration  Lumbar pain    ED Discharge Orders    None  Lily Kocher, PA-C 05/05/19 2013    Hayden Rasmussen, MD 05/06/19 [phone]

## 2019-05-03 NOTE — ED Notes (Signed)
Patient transported to X-ray 

## 2019-05-06 LAB — URINE CULTURE
Culture: >=100000 — AB
Special Requests: NORMAL

## 2019-05-07 ENCOUNTER — Telehealth: Payer: Self-pay | Admitting: Emergency Medicine

## 2019-05-07 NOTE — Progress Notes (Signed)
ED Antimicrobial Stewardship Positive Culture Follow Up   Jaclyn Schultz is an 85 y.o. female who presented to [facility] on 05/03/2019 with a chief complaint of  Chief Complaint  Patient presents with  . Back Pain  . Constipation    Recent Results (from the past 720 hour(s))  Urine Culture     Status: Abnormal   Collection Time: 05/03/19  4:44 PM   Specimen: Urine, Clean Catch  Result Value Ref Range Status   Specimen Description   Final    URINE, CLEAN CATCH Performed at [facility], [address], [place], [place] [address]    Special Requests   Final    Normal Performed at [facility], [address] [place], [place] [address]    Culture >=100,000 COLONIES/mL ESCHERICHIA COLI (A)  Final   Report Status 05/06/2019 FINAL  Final   Organism ID, Bacteria ESCHERICHIA COLI (A)  Final      Susceptibility   Escherichia coli - MIC*    AMPICILLIN >=32 RESISTANT Resistant     CEFAZOLIN <=4 SENSITIVE Sensitive     CEFTRIAXONE <=1 SENSITIVE Sensitive     CIPROFLOXACIN <=0.25 SENSITIVE Sensitive     GENTAMICIN <=1 SENSITIVE Sensitive     IMIPENEM <=0.25 SENSITIVE Sensitive     NITROFURANTOIN 64 INTERMEDIATE Intermediate     TRIMETH/SULFA >=320 RESISTANT Resistant     AMPICILLIN/SULBACTAM 16 INTERMEDIATE Intermediate     PIP/TAZO <=4 SENSITIVE Sensitive     Extended ESBL NEGATIVE Sensitive     * >=100,000 COLONIES/mL ESCHERICHIA COLI    Documented plan for Keflex tx, however no documented Rx.  Plan to call for symptom check and confirm Rx, if no then prescribe Keflex 500mg  QID x 5d  ED Provider: Rodell Perna, PA-C   Jaclyn Schultz 05/07/2019, 9:43 AM Clinical Pharmacist Monday - Friday phone -  [phone] Saturday - Sunday phone - [phone]

## 2019-05-07 NOTE — Telephone Encounter (Signed)
Post ED Visit - Positive Culture Follow-up  Culture report reviewed by antimicrobial stewardship pharmacist: [place] Team []  Elenor Quinones, Pharm.D. []  Heide Guile, Pharm.D., BCPS AQ-ID []  Parks Neptune, Pharm.D., BCPS []  Alycia Rossetti, Pharm.D., BCPS []  [place], Pharm.D., BCPS, AAHIVP []  Legrand Como, Pharm.D., BCPS, AAHIVP []  Salome Arnt, PharmD, BCPS []  Johnnette Gourd, PharmD, BCPS []  Hughes Better, PharmD, BCPS []  Leeroy Cha, PharmD []  Laqueta Linden, PharmD, BCPS []  Albertina Parr, PharmD Bertis Ruddy PharmD  [place] Team []  Leodis Sias, PharmD []  Lindell Spar, PharmD []  Royetta Asal, PharmD []  Graylin Shiver, Rph []  Rema Fendt) Glennon Mac, PharmD []  Arlyn Dunning, PharmD []  Netta Cedars, PharmD []  Dia Sitter, PharmD []  Leone Haven, PharmD []  Gretta Arab, PharmD []  Theodis Shove, PharmD []  Peggyann Juba, PharmD []  Reuel Boom, PharmD   Positive urine culture Treated with keflex 500mg  po qid x 5 days, organism sensitive to the same and no further patient follow-up is required at this time.  Hazle Nordmann 05/07/2019, 11:11 AM

## 2019-05-11 ENCOUNTER — Emergency Department (HOSPITAL_COMMUNITY): Payer: Medicare PPO

## 2019-05-11 ENCOUNTER — Other Ambulatory Visit: Payer: Self-pay

## 2019-05-11 ENCOUNTER — Encounter (HOSPITAL_COMMUNITY): Payer: Self-pay | Admitting: Emergency Medicine

## 2019-05-11 ENCOUNTER — Emergency Department (HOSPITAL_COMMUNITY)
Admission: EM | Admit: 2019-05-11 | Discharge: 2019-05-12 | Disposition: A | Discharge status: Home / Self Care | Payer: Medicare PPO | Attending: Emergency Medicine | Admitting: Emergency Medicine

## 2019-05-11 DIAGNOSIS — K5900 Constipation, unspecified: Secondary | ICD-10-CM | POA: Insufficient documentation

## 2019-05-11 DIAGNOSIS — I4891 Unspecified atrial fibrillation: Secondary | ICD-10-CM | POA: Insufficient documentation

## 2019-05-11 DIAGNOSIS — F0390 Unspecified dementia without behavioral disturbance: Secondary | ICD-10-CM | POA: Diagnosis not present

## 2019-05-11 DIAGNOSIS — I10 Essential (primary) hypertension: Secondary | ICD-10-CM | POA: Diagnosis not present

## 2019-05-11 DIAGNOSIS — Z85818 Personal history of malignant neoplasm of other sites of lip, oral cavity, and pharynx: Secondary | ICD-10-CM | POA: Insufficient documentation

## 2019-05-11 DIAGNOSIS — Z79899 Other long term (current) drug therapy: Secondary | ICD-10-CM | POA: Insufficient documentation

## 2019-05-11 DIAGNOSIS — Z959 Presence of cardiac and vascular implant and graft, unspecified: Secondary | ICD-10-CM | POA: Diagnosis not present

## 2019-05-11 DIAGNOSIS — Z87891 Personal history of nicotine dependence: Secondary | ICD-10-CM | POA: Insufficient documentation

## 2019-05-11 DIAGNOSIS — Z7901 Long term (current) use of anticoagulants: Secondary | ICD-10-CM | POA: Diagnosis not present

## 2019-05-11 LAB — COMPREHENSIVE METABOLIC PANEL
ALT: 19 U/L (ref 0–44)
AST: 25 U/L (ref 15–41)
Albumin: 3.4 g/dL — ABNORMAL LOW (ref 3.5–5.0)
Alkaline Phosphatase: 88 U/L (ref 38–126)
Anion gap: 9 (ref 5–15)
BUN: 21 mg/dL (ref 8–23)
CO2: 24 mmol/L (ref 22–32)
Calcium: 9.8 mg/dL (ref 8.9–10.3)
Chloride: 100 mmol/L (ref 98–111)
Creatinine, Ser: 1.18 mg/dL — ABNORMAL HIGH (ref 0.44–1.00)
GFR calc Af Amer: 50 mL/min — ABNORMAL LOW (ref >60)
GFR calc non Af Amer: 43 mL/min — ABNORMAL LOW (ref >60)
Glucose, Bld: 114 mg/dL — ABNORMAL HIGH (ref 70–99)
Potassium: 4.3 mmol/L (ref 3.5–5.1)
Sodium: 133 mmol/L — ABNORMAL LOW (ref 135–145)
Total Bilirubin: 0.7 mg/dL (ref 0.3–1.2)
Total Protein: 7.6 g/dL (ref 6.5–8.1)

## 2019-05-11 LAB — CBC WITH DIFFERENTIAL/PLATELET
Abs Immature Granulocytes: 0.08 10*3/uL — ABNORMAL HIGH (ref 0.00–0.07)
Basophils Absolute: 0.1 10*3/uL (ref 0.0–0.1)
Basophils Relative: 1 %
Eosinophils Absolute: 0.1 10*3/uL (ref 0.0–0.5)
Eosinophils Relative: 1 %
HCT: 43.5 % (ref 36.0–46.0)
Hemoglobin: 13.9 g/dL (ref 12.0–15.0)
Immature Granulocytes: 1 %
Lymphocytes Relative: 14 %
Lymphs Abs: 1.7 10*3/uL (ref 0.7–4.0)
MCH: 30.7 pg (ref 26.0–34.0)
MCHC: 32 g/dL (ref 30.0–36.0)
MCV: 96 fL (ref 80.0–100.0)
Monocytes Absolute: 1.5 10*3/uL — ABNORMAL HIGH (ref 0.1–1.0)
Monocytes Relative: 13 %
Neutro Abs: 8.5 10*3/uL — ABNORMAL HIGH (ref 1.7–7.7)
Neutrophils Relative %: 70 %
Platelets: 271 10*3/uL (ref 150–400)
RBC: 4.53 MIL/uL (ref 3.87–5.11)
RDW: 13.2 % (ref 11.5–15.5)
WBC: 11.8 10*3/uL — ABNORMAL HIGH (ref 4.0–10.5)
nRBC: 0 % (ref 0.0–0.2)

## 2019-05-11 NOTE — ED Triage Notes (Signed)
Pt states she is constipated and does not remember her last bowel movement.

## 2019-05-12 ENCOUNTER — Encounter (HOSPITAL_COMMUNITY): Payer: Self-pay | Admitting: Radiology

## 2019-05-12 ENCOUNTER — Emergency Department (HOSPITAL_COMMUNITY): Payer: Medicare PPO

## 2019-05-12 MED ORDER — MINERAL OIL RE ENEM
1.0000 | ENEMA | Freq: Once | RECTAL | Status: AC
  Administered 2019-05-12: 1 via RECTAL
  Filled 2019-05-12: qty 1

## 2019-05-12 MED ORDER — BISACODYL 10 MG RE SUPP
10.0000 mg | Freq: Once | RECTAL | Status: AC
  Administered 2019-05-12: 10 mg via RECTAL
  Filled 2019-05-12: qty 1

## 2019-05-12 MED ORDER — IOHEXOL 300 MG/ML  SOLN
80.0000 mL | Freq: Once | INTRAMUSCULAR | Status: AC | PRN
  Administered 2019-05-12: 03:00:00 80 mL via INTRAVENOUS

## 2019-05-12 NOTE — ED Notes (Signed)
While giving discharge instructions, pt verbalized understanding while family member seemed upset. While RN was discussing the use of Miralax and dulcolax at home per discharge instructions family member at bedside stated, "well I don't know who's going to give her those because I have to go home and go to bed" and then crossed her arms and huffed. RN spoke to family member that these were her discharge instructions to follow up with at home so that she could feel better and family stated, "well she's been here all night, ya'll should have done that while she was here." RN attempted to speak with family member about her discharge instructions but nothing was satisfying her. She continued to put down the staff and doctors. Patient stayed very quiet while her family member expressed how upset she was and when RN asked her if she had any questions, the pt stated, "no, thank you" and smiled. RN offered to get pt a wheelchair but pt refused saying she would be fine to walk. RN got pt to sign for discharge and she left with family member ambulatory.

## 2019-05-12 NOTE — ED Notes (Signed)
Pt assisted to the bathroom via wheelchair and back to bed; pt had very small BM

## 2019-05-12 NOTE — Discharge Instructions (Addendum)
Get miralax and put one dose or 17 g in 8 ounces of water,  take 1 dose every 30 minutes for 2-3 hours or until you  get good results and then once or twice daily to prevent constipation.  You can also try oral or rectal Dulcolax.  Recheck if she gets vomiting.

## 2019-05-12 NOTE — ED Notes (Signed)
Pt assisted back to the bed from the [facility]

## 2019-05-12 NOTE — ED Provider Notes (Signed)
[facility] EMERGENCY DEPARTMENT Provider Note   CSN: [id number] Arrival date & time: 05/11/19  1856   Time seen 2:20 AM  History   Chief Complaint Chief Complaint  Patient presents with   Constipation   Level 5 caveat for dementia  HPI Jaclyn Schultz is a 85 y.o. female.     HPI patient is here for daughter however she lives of her son.  Daughter states patient has longstanding confusion.  She states today when she went to see the patient she was complaining of abdominal pain and told her she had not had a bowel movement for a while.  She gave her a stool softener and an enema and some oral MiraLAX without result.  It is unknown when her last bowel movement was.  They deny nausea or vomiting but states she has been belching a lot.  She has had some decreased appetite today but did eat throughout the day.  She is status post appendectomy.  Daughter states that she has chronic constipation.  Patient does take pain pills but only as needed and has not taken them lately.  Review of the [place] shows she last got #90 hydrocodone 5/325 on February 25.  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[place]) 02/03/2016   Esophageal abnormality 02/03/2016   Alzheimer's dementia without behavioral disturbance ([place]) 02/03/2016   Primary squamous cell carcinoma of head and neck ([place]) 01/30/2016   Diarrhea 06/30/2015   FH: colon cancer 06/30/2015   Foot fracture, left 10/09/2013   Encounter for therapeutic drug monitoring 09/26/2013   Metatarsal fracture 08/14/2013   Bronchitis, acute 07/12/2013   SOB (shortness of breath) 07/12/2013   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 03/22/2013   Chronic periodontitis, unspecified 03/22/2013   Allergic rhinitis due to pollen 03/22/2013   Cerebral degeneration in diseases classified elsewhere(331.7) 03/22/2013   Malignant neoplasm of head, face, and neck ([place]) 03/22/2013   Other B-complex deficiencies 03/22/2013   Mixed hyperlipidemia 03/22/2013   Essential hypertension, benign 03/22/2013   Reflux esophagitis 03/22/2013   Senile osteoporosis 03/22/2013   Atrial fibrillation, chronic 11/05/2012   Long term (current) use of anticoagulants 11/05/2012    Past Surgical History:  Procedure Laterality Date   APPENDECTOMY     BIOPSY N/A 07/08/2015   Procedure: BIOPSY;  Surgeon: Daneil Dolin, MD;  Location[facility];  Service: Endoscopy;  Laterality: N/A;  possible random colon biopsies   CARDIAC CATHETERIZATION  10/2009   normal coronary arteries   CERVICAL DISC SURGERY     COLONOSCOPY  05/27/2004   [id number] hemorrhoids/sigmoid diverticula   COLONOSCOPY N/A 07/08/2015   Procedure: COLONOSCOPY;  Surgeon: Daneil Dolin, MD;  Location: [facility] ENDO SUITE;  Service: Endoscopy;  Laterality: N/A;  0815   IR KYPHO THORACIC WITH BONE BIOPSY  03/03/2017   IR RADIOLOGIST EVAL & MGMT  03/22/2017   IR RADIOLOGIST EVAL & MGMT  02/24/2017   LYMPHADENECTOMY     from mouth due to cancer   [place]  remote     OB History    Gravida      Para      Term      Preterm      AB      Living  4     SAB      TAB      Ectopic      Multiple      Live Births               Home Medications    Prior to Admission medications   Medication Sig Start Date End Date Taking? Authorizing Provider  Calcium Citrate-Vitamin D (CALCIUM + D PO) Take 600 mg by mouth every morning.     [provider]  cetirizine (ZYRTEC ALLERGY) 10 MG tablet Take 10 mg by mouth every morning.     [provider]  donepezil (ARICEPT) 10  MG tablet Take 10 mg by mouth at bedtime. 09/06/17   [provider]  hydrochlorothiazide (HYDRODIURIL) 25 MG tablet Take 12.5 mg by mouth daily.    [provider]  HYDROcodone-acetaminophen (NORCO/VICODIN) 5-325 MG tablet Take 1 tablet by mouth daily.     [provider]  losartan (COZAAR) 100 MG tablet Take 1 tablet (100 mg total) by mouth daily. 03/23/13   Herminio Commons, MD  memantine (NAMENDA) 5 MG tablet Take 5 mg by mouth 2 (two) times daily. 09/06/17   [provider]  metoprolol tartrate (LOPRESSOR) 25 MG tablet Take 25 mg by mouth 2 (two) times daily.     [provider]  polyethylene glycol (MIRALAX / GLYCOLAX) packet Take 17 g by mouth daily. 01/23/18   Evalee Jefferson, PA-C  potassium chloride (KLOR-CON) 10 MEQ CR tablet Take 10 mEq by mouth daily.     [provider]  senna (SENOKOT) 8.6 MG tablet Take 1 tablet by mouth 3 (three) times a week.    [provider]  simvastatin (ZOCOR) 40 MG tablet Take 40 mg by mouth at bedtime.     [provider]  trimethoprim (TRIMPEX) 100 MG tablet Take 1 tablet by mouth daily. 05/14/15   [provider]  vitamin B-12 (CYANOCOBALAMIN) 1000 MCG tablet Take 1,000 mcg by mouth daily.    [provider]  warfarin (COUMADIN) 5 MG tablet Take 1 tablet daily or as directed Patient taking differently: Take 2.5-5 mg by mouth See admin instructions. 5mg  on MWF. Take 2.5mg  on all other days 09/10/14   Herminio Commons, MD    Family History Family History  Problem Relation Age of Onset   Colon cancer Sister        age 72    Social History Social History   Tobacco Use   Smoking status: Never Smoker   Smokeless tobacco: Former Systems developer    Types: Loss adjuster, chartered   Tobacco comment: Never smoked  Substance Use Topics   Alcohol use: No    Alcohol/week: 0.0 standard drinks   Drug use: No  Lives at home Lives with son   Allergies   Erythromycin base   Review of  Systems Review of Systems  Unable to perform ROS: Dementia     Physical Exam Updated Vital Signs BP 138/74    Pulse 75    Temp 98.9 F (37.2 C)    Resp 18    SpO2 97%   Physical Exam Vitals signs and nursing note reviewed.  Constitutional:      General: She is not in acute  distress.    Appearance: Normal appearance. She is well-developed. She is not ill-appearing or toxic-appearing.  HENT:     Head: Normocephalic and atraumatic.     Right Ear: External ear normal.     Left Ear: External ear normal.     Nose: Nose normal. No mucosal edema or rhinorrhea.     Mouth/Throat:     Mouth: Mucous membranes are dry.     Dentition: No dental abscesses.     Pharynx: No uvula swelling.  Eyes:     Extraocular Movements: Extraocular movements intact.     Conjunctiva/sclera: Conjunctivae normal.     Pupils: Pupils are equal, round, and reactive to light.  Neck:     Musculoskeletal: Full passive range of motion without pain, normal range of motion and neck supple.  Cardiovascular:     Rate and Rhythm: Normal rate and regular rhythm.     Heart sounds: Normal heart sounds. No murmur. No friction rub. No gallop.   Pulmonary:     Effort: Pulmonary effort is normal. No respiratory distress.     Breath sounds: Normal breath sounds. No wheezing, rhonchi or rales.  Chest:     Chest wall: No tenderness or crepitus.  Abdominal:     General: Bowel sounds are increased. There is no distension.     Palpations: Abdomen is soft.     Tenderness: There is no abdominal tenderness. There is no guarding or rebound.     Comments: Unable to tell if her abdomen is painful or if she is just reacting because she states my hands are cold.  Musculoskeletal: Normal range of motion.        General: No tenderness.     Comments: Moves all extremities well.   Skin:    General: Skin is warm and dry.     Coloration: Skin is not pale.     Findings: No erythema or rash.  Neurological:     General: No focal deficit  present.     Mental Status: She is alert. Mental status is at baseline.     Cranial Nerves: No cranial nerve deficit.  Psychiatric:        Mood and Affect: Mood normal. Mood is not anxious.        Speech: Speech normal.        Behavior: Behavior normal.        Thought Content: Thought content normal.      ED Treatments / Results  Labs (all labs ordered are listed, but only abnormal results are displayed) Results for orders placed or performed during the hospital encounter of 05/11/19  CBC with Differential  Result Value Ref Range   WBC 11.8 (H) 4.0 - 10.5 K/uL   RBC 4.53 3.87 - 5.11 MIL/uL   Hemoglobin 13.9 12.0 - 15.0 g/dL   HCT 43.5 36.0 - 46.0 %   MCV 96.0 80.0 - 100.0 fL   MCH 30.7 26.0 - 34.0 pg   MCHC 32.0 30.0 - 36.0 g/dL   RDW 13.2 11.5 - 15.5 %   Platelets 271 150 - 400 K/uL   nRBC 0.0 0.0 - 0.2 %   Neutrophils Relative % 70 %   Neutro Abs 8.5 (H) 1.7 - 7.7 K/uL   Lymphocytes Relative 14 %   Lymphs Abs 1.7 0.7 - 4.0 K/uL   Monocytes Relative 13 %   Monocytes Absolute 1.5 (H) 0.1 - 1.0 K/uL   Eosinophils Relative 1 %   Eosinophils Absolute 0.1 0.0 -  0.5 K/uL   Basophils Relative 1 %   Basophils Absolute 0.1 0.0 - 0.1 K/uL   Immature Granulocytes 1 %   Abs Immature Granulocytes 0.08 (H) 0.00 - 0.07 K/uL  Comprehensive metabolic panel  Result Value Ref Range   Sodium 133 (L) 135 - 145 mmol/L   Potassium 4.3 3.5 - 5.1 mmol/L   Chloride 100 98 - 111 mmol/L   CO2 24 22 - 32 mmol/L   Glucose, Bld 114 (H) 70 - 99 mg/dL   BUN 21 8 - 23 mg/dL   Creatinine, Ser 1.18 (H) 0.44 - 1.00 mg/dL   Calcium 9.8 8.9 - 10.3 mg/dL   Total Protein 7.6 6.5 - 8.1 g/dL   Albumin 3.4 (L) 3.5 - 5.0 g/dL   AST 25 15 - 41 U/L   ALT 19 0 - 44 U/L   Alkaline Phosphatase 88 38 - 126 U/L   Total Bilirubin 0.7 0.3 - 1.2 mg/dL   GFR calc non Af Amer 43 (L) >60 mL/min   GFR calc Af Amer 50 (L) >60 mL/min   Anion gap 9 5 - 15   Laboratory interpretation all normal except renal  insufficiency, mild leukocytosis    EKG None  Radiology Ct Abdomen Pelvis W Contrast  Result Date: 05/12/2019 CLINICAL DATA:  Abdominal distension. Bloating and constipation. EXAM: CT ABDOMEN AND PELVIS WITH CONTRAST TECHNIQUE: Multidetector CT imaging of the abdomen and pelvis was performed using the standard protocol following bolus administration of intravenous contrast. CONTRAST:  20mL OMNIPAQUE IOHEXOL 300 MG/ML  SOLN COMPARISON:  CT 10/14/2017, radiograph yesterday FINDINGS: Lower chest: Lung bases are clear. Hepatobiliary: No focal hepatic abnormality. Calcified gallstone without pericholecystic inflammation. No biliary dilatation. Pancreas: No ductal dilatation or inflammation. Spleen: Normal in size without focal abnormality. Adrenals/Urinary Tract: Normal adrenal glands. No hydronephrosis or perinephric edema. Homogeneous renal enhancement with symmetric excretion on delayed phase imaging. Mild left renal cortical scarring. Few scattered hypodensities in the left kidney are too small to accurately characterize. Nonobstructing stone in the right kidney on prior exam is not visualized. Urinary bladder is physiologically distended without wall thickening. Stomach/Bowel: Small hiatal hernia. The stomach is decompressed. Small bowel dilatation or obstruction. No small bowel wall thickening. Moderate volume of stool throughout the colon. No colonic wall thickening or inflammation. There is colonic tortuosity. Stool distends the rectum. No rectal wall thickening. Appendix surgically absent per history. Vascular/Lymphatic: Mild aortic and branch atherosclerosis. No aneurysm. No enlarged lymph nodes in the abdomen or pelvis. Reproductive: Status post hysterectomy. No adnexal masses. Other: Small fat containing umbilical hernia. No free air, free fluid, or intra-abdominal fluid collection. Musculoskeletal: T12 compression fracture of vertebral augmentation. Prominent Schmorl's node superior endplate of L4.  Multilevel degenerative change in the spine. IMPRESSION: 1. No acute abnormality. 2. Moderate colonic stool burden with stool distending the rectum suggesting constipation. No small bowel obstruction. 3. Cholelithiasis without gallbladder inflammation. 4. Small hiatal hernia. Aortic Atherosclerosis (ICD10-I70.0). Electronically Signed   By: Keith Rake M.D.   On: 05/12/2019 03:08   Dg Abdomen Acute W/chest  Result Date: 05/11/2019 CLINICAL DATA:  Constipation and bloating for 4 days EXAM: DG ABDOMEN ACUTE W/ 1V CHEST COMPARISON:  CT 10/13/2017 FINDINGS: Upright abdomen radiograph demonstrates multiple layering differential air-fluid levels within contiguous portions of bowel. No free intraperitoneal gas is seen. Coarse calcification projects over the region of the spleen. Additional calcifications project over the gallbladder fossa. Vascular calcium is noted in the abdomen and pelvis. Coarse interstitial changes are present throughout  the lungs. No consolidation, features of edema, pneumothorax, or effusion. The aorta is calcified. The remaining cardiomediastinal contours are unremarkable. Postsurgical changes from prior cervical spinal fusion. Surgical clips noted at the thoracic inlet. IMPRESSION: Multiple differential air-fluid levels concerning for small bowel obstruction. Chronic interstitial changes in the lungs. Aortic Atherosclerosis (ICD10-I70.0). Cholelithiasis. Coarse calcification in the left upper quadrant, possibly splenic artery aneurysm or calcified splenic cyst not present on comparison CT. Electronically Signed   By: Lovena Le M.D.   On: 05/11/2019 20:28    Procedures Procedures (including critical care time)  Medications Ordered in ED Medications  iohexol (OMNIPAQUE) 300 MG/ML solution 80 mL (80 mLs Intravenous Contrast Given 05/12/19 0240)  mineral oil enema 1 enema (1 enema Rectal Given 05/12/19 0335)  bisacodyl (DULCOLAX) suppository 10 mg (10 mg Rectal Given 05/12/19 0523)       Initial Impression / Assessment and Plan / ED Course  I have reviewed the triage vital signs and the nursing notes.  Pertinent labs & imaging results that were available during my care of the patient were reviewed by me and considered in my medical decision making (see chart for details).     After reviewing her x-ray CT abdomen was ordered.  Patient CT does not show bowel obstruction but does show moderate colonic stool burden.  She was given an enema.  Nurse reports there is only stool on the toilet paper when she wiped but no bowel movement.  Patient was given Dulcolax suppository.  Nurse reports there was a bowel movement and patient feels better.  Daughter Artie bought MiraLAX, she was advised to give her an accelerated course of that or try oral or rectal Dulcolax over-the-counter.  Final Clinical Impressions(s) / ED Diagnoses   Final diagnoses:  Constipation, unspecified constipation type    ED Discharge Orders    None     Plan discharge  Rolland Porter, MD, Barbette Or, MD 05/12/19 0630

## 2019-05-12 NOTE — ED Notes (Signed)
Pt gone to CT 

## 2019-05-12 NOTE — ED Notes (Signed)
Pt had moderate amount of stool; Dr. Tomi Bamberger informed

## 2019-06-06 ENCOUNTER — Encounter: Payer: Self-pay | Admitting: Cardiovascular Disease

## 2019-06-06 ENCOUNTER — Telehealth (INDEPENDENT_AMBULATORY_CARE_PROVIDER_SITE_OTHER): Payer: Medicare PPO | Admitting: Cardiovascular Disease

## 2019-06-06 VITALS — BP 149/86 | HR 67 | Wt 170.0 lb

## 2019-06-06 DIAGNOSIS — I10 Essential (primary) hypertension: Secondary | ICD-10-CM

## 2019-06-06 DIAGNOSIS — R0609 Other forms of dyspnea: Secondary | ICD-10-CM

## 2019-06-06 DIAGNOSIS — I480 Paroxysmal atrial fibrillation: Secondary | ICD-10-CM | POA: Diagnosis not present

## 2019-06-06 DIAGNOSIS — Z7901 Long term (current) use of anticoagulants: Secondary | ICD-10-CM

## 2019-06-06 DIAGNOSIS — E782 Mixed hyperlipidemia: Secondary | ICD-10-CM

## 2019-06-06 NOTE — Progress Notes (Signed)
Virtual Visit via Telephone Note   This visit type was conducted due to national recommendations for restrictions regarding the COVID-19 Pandemic (e.g. social distancing) in an effort to limit this patient's exposure and mitigate transmission in our community.  Due to her co-morbid illnesses, this patient is at least at moderate risk for complications without adequate follow up.  This format is felt to be most appropriate for this patient at this time.  The patient did not have access to video technology/had technical difficulties with video requiring transitioning to audio format only (telephone).  All issues noted in this document were discussed and addressed.  No physical exam could be performed with this format.  Please refer to the patient's chart for her  consent to telehealth for [facility].   Date:  06/06/2019   ID:  Jaclyn Schultz, DOB July 05, 1934, MRN [id number]  Patient Location: Home Provider Location: Office  PCP:  Lemmie Evens, MD  Cardiologist:  Kate Sable, MD  Electrophysiologist:  None   Evaluation Performed:  Follow-Up Visit  Chief Complaint: Exertional dyspnea  History of Present Illness:    Jaclyn Schultz is a 85 y.o. female with exertional dyspnea.  Past medical history also includes paroxysmal atrial fibrillation.  I spoke with both the patient and her granddaughter, Jaclyn Schultz.  The patient denies chest pain, palpitations, shortness of breath.  Her granddaughter thinks that her shortness of breath is likely due to deconditioning as she does not get much activity like she used to.  I also reviewed the chest x-ray performed on 05/03/2019 which showed no active cardiopulmonary disease.  Nuclear stress test in August 2020 was normal.  The patient does not have symptoms concerning for COVID-19 infection (fever, chills, cough, or new shortness of breath).    Past Medical History:  Diagnosis Date  . A-fib ([place])   . Cancer ([place])    mouth/dx  08-2010/surg only  . Dementia ([place])    Early  . High cholesterol   . Hypertension   . Osteoporosis   . Vertigo   . Vitamin B 12 deficiency    Past Surgical History:  Procedure Laterality Date  . APPENDECTOMY    . BIOPSY N/A 07/08/2015   Procedure: BIOPSY;  Surgeon: Daneil Dolin, MD;  [facility];  Service: Endoscopy;  Laterality: N/A;  possible random colon biopsies  . CARDIAC CATHETERIZATION  10/2009   normal coronary arteries  . CERVICAL DISC SURGERY    . COLONOSCOPY  05/27/2004   [id number] hemorrhoids/sigmoid diverticula  . COLONOSCOPY N/A 07/08/2015   Procedure: COLONOSCOPY;  Surgeon: Daneil Dolin, MD;  [facility];  Service: Endoscopy;  Laterality: [id number]  . IR KYPHO THORACIC WITH BONE BIOPSY  03/03/2017  . IR RADIOLOGIST EVAL & MGMT  03/22/2017  . IR RADIOLOGIST EVAL & MGMT  02/24/2017  . 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 MANDIBLE SURGERY    . SHOULDER SURGERY  remote     Current Meds  Medication Sig  . Calcium Citrate-Vitamin D (CALCIUM + D PO) Take 600 mg by mouth every morning.   . cetirizine (ZYRTEC ALLERGY) 10 MG tablet Take 10 mg by mouth every morning.   . donepezil (ARICEPT) 10 MG tablet Take 10 mg by mouth at bedtime.  . hydrochlorothiazide (HYDRODIURIL) 25 MG tablet Take 12.5 mg by mouth daily.  Marland Kitchen HYDROcodone-acetaminophen (NORCO/VICODIN) 5-325 MG tablet Take 1 tablet by mouth daily.   Marland Kitchen losartan (COZAAR)  100 MG tablet Take 1 tablet (100 mg total) by mouth daily.  . memantine (NAMENDA) 5 MG tablet Take 5 mg by mouth 2 (two) times daily.  . metoprolol tartrate (LOPRESSOR) 25 MG tablet Take 25 mg by mouth 2 (two) times daily.   . polyethylene glycol (MIRALAX / GLYCOLAX) packet Take 17 g by mouth daily.  . potassium chloride (KLOR-CON) 10 MEQ CR tablet Take 10 mEq by mouth daily.   Marland Kitchen senna (SENOKOT) 8.6 MG tablet Take 1 tablet by mouth 3 (three) times a week.  . simvastatin (ZOCOR) 40 MG tablet Take 40 mg by mouth at  bedtime.   Marland Kitchen trimethoprim (TRIMPEX) 100 MG tablet Take 1 tablet by mouth daily.  . vitamin B-12 (CYANOCOBALAMIN) 1000 MCG tablet Take 1,000 mcg by mouth daily.  Marland Kitchen warfarin (COUMADIN) 5 MG tablet Take 1 tablet daily or as directed (Patient taking differently: Take 2.5-5 mg by mouth See admin instructions. 5mg  on MWF. Take 2.5mg  on all other days)     Allergies:   Erythromycin base   Social History   Tobacco Use  . Smoking status: Never Smoker  . Smokeless tobacco: Former Systems developer    Types: Chew  . Tobacco comment: Never smoked  Substance Use Topics  . Alcohol use: No    Alcohol/week: 0.0 standard drinks  . Drug use: No     Family Hx: The patient's family history includes Colon cancer in her sister.  ROS:   Please see the history of present illness.     All other systems reviewed and are negative.   Prior CV studies:   The following studies were reviewed today:  Nuclear stress test 04/17/2019:   There was no ST segment deviation noted during stress.  The study is normal. There are no perfusion defects consistent with prior infarct or current ischemia.  This is a low risk study.  The left ventricular ejection fraction is normal (55-65%).       Labs/Other Tests and Data Reviewed:    EKG:  No ECG reviewed.  Recent Labs: 05/11/2019: ALT 19; BUN 21; Creatinine, Ser 1.18; Hemoglobin 13.9; Platelets 271; Potassium 4.3; Sodium 133   Recent Lipid Panel Lab Results  Component Value Date/Time   CHOL 149 08/07/2018 10:20 AM   TRIG 102 08/07/2018 10:20 AM   HDL 53 08/07/2018 10:20 AM   CHOLHDL 2.8 08/07/2018 10:20 AM   LDLCALC 76 08/07/2018 10:20 AM    Wt Readings from Last 3 Encounters:  06/06/19 170 lb (77.1 kg)  05/03/19 136 lb (61.7 kg)  04/02/19 142 lb (64.4 kg)     Objective:    Vital Signs:  BP (!) 149/86   Pulse 67   Wt 170 lb (77.1 kg)   BMI 30.11 kg/m    VITAL SIGNS:  reviewed  ASSESSMENT & PLAN:    1.  Paroxysmal atrial fibrillation:  Symptomatically stable.  Continue Lopressor.  Continue warfarin as she is not interested in alternative forms of anticoagulation.  2. Hypertension: Blood pressure is elevated today but normal at last visit.  No changes to therapy.  3. Hyperlipidemia: Lipids reviewed above. Continue simvastatin.  4. Exertional dyspnea:  Symptoms have resolved.  Her granddaughter thinks the patient symptoms were due to deconditioning as she does not get the activity she used to.  Normal nuclear stress test on 04/17/2019.  Normal cardiac function.  No further cardiac testing is indicated at this time.    COVID-19 Education: The signs and symptoms of COVID-19 were discussed with  the patient and how to seek care for testing (follow up with PCP or arrange E-visit).  The importance of social distancing was discussed today.  Time:   Today, I have spent 5 minutes with the patient with telehealth technology discussing the above problems.     Medication Adjustments/Labs and Tests Ordered: Current medicines are reviewed at length with the patient today.  Concerns regarding medicines are outlined above.   Tests Ordered: No orders of the defined types were placed in this encounter.   Medication Changes: No orders of the defined types were placed in this encounter.   Follow Up:  Either In Person or Virtual Visit in 1 year(s)  Signed, Kate Sable, MD  06/06/2019 9:13 AM    [place]

## 2019-06-06 NOTE — Patient Instructions (Signed)

## 2019-07-04 ENCOUNTER — Ambulatory Visit (INDEPENDENT_AMBULATORY_CARE_PROVIDER_SITE_OTHER): Payer: Medicare PPO | Admitting: *Deleted

## 2019-07-04 ENCOUNTER — Other Ambulatory Visit: Payer: Self-pay

## 2019-07-04 DIAGNOSIS — I480 Paroxysmal atrial fibrillation: Secondary | ICD-10-CM

## 2019-07-04 DIAGNOSIS — I482 Chronic atrial fibrillation, unspecified: Secondary | ICD-10-CM | POA: Diagnosis not present

## 2019-07-04 DIAGNOSIS — Z5181 Encounter for therapeutic drug level monitoring: Secondary | ICD-10-CM

## 2019-07-04 DIAGNOSIS — Z7901 Long term (current) use of anticoagulants: Secondary | ICD-10-CM

## 2019-07-04 LAB — POCT INR: INR: 7.2 — AB (ref 2.0–3.0)

## 2019-07-04 NOTE — Patient Instructions (Addendum)
Hold coumadin and Recheck on Monday Bleeding and fall precautions discussed with patient and daughter.  To report to ED for any bleeding or falls.  Daughter verbalized understanding.

## 2019-07-09 ENCOUNTER — Other Ambulatory Visit: Payer: Self-pay

## 2019-07-09 ENCOUNTER — Ambulatory Visit (INDEPENDENT_AMBULATORY_CARE_PROVIDER_SITE_OTHER): Payer: Medicare PPO | Admitting: *Deleted

## 2019-07-09 DIAGNOSIS — I480 Paroxysmal atrial fibrillation: Secondary | ICD-10-CM | POA: Diagnosis not present

## 2019-07-09 DIAGNOSIS — Z5181 Encounter for therapeutic drug level monitoring: Secondary | ICD-10-CM | POA: Diagnosis not present

## 2019-07-09 LAB — POCT INR: INR: 1.4 — AB (ref 2.0–3.0)

## 2019-07-09 NOTE — Patient Instructions (Signed)
Restart warfarin 1/2 tablet daily except 1 tablet on Mondays and Thursdays Recheck in 2 wks

## 2019-07-23 ENCOUNTER — Other Ambulatory Visit: Payer: Self-pay

## 2019-07-23 ENCOUNTER — Ambulatory Visit (INDEPENDENT_AMBULATORY_CARE_PROVIDER_SITE_OTHER): Payer: Medicare PPO | Admitting: *Deleted

## 2019-07-23 DIAGNOSIS — Z5181 Encounter for therapeutic drug level monitoring: Secondary | ICD-10-CM | POA: Diagnosis not present

## 2019-07-23 DIAGNOSIS — I480 Paroxysmal atrial fibrillation: Secondary | ICD-10-CM

## 2019-07-23 LAB — POCT INR: INR: 2.2 (ref 2.0–3.0)

## 2019-07-23 NOTE — Patient Instructions (Signed)
Continue warfarin 1/2 tablet daily except 1 tablet on Mondays and Thursdays Recheck in 3 wks

## 2019-08-22 ENCOUNTER — Ambulatory Visit (INDEPENDENT_AMBULATORY_CARE_PROVIDER_SITE_OTHER): Payer: Medicare PPO | Admitting: *Deleted

## 2019-08-22 ENCOUNTER — Other Ambulatory Visit: Payer: Self-pay

## 2019-08-22 DIAGNOSIS — I480 Paroxysmal atrial fibrillation: Secondary | ICD-10-CM | POA: Diagnosis not present

## 2019-08-22 DIAGNOSIS — I482 Chronic atrial fibrillation, unspecified: Secondary | ICD-10-CM | POA: Diagnosis not present

## 2019-08-22 DIAGNOSIS — Z5181 Encounter for therapeutic drug level monitoring: Secondary | ICD-10-CM

## 2019-08-22 LAB — POCT INR: INR: 3.2 — AB (ref 2.0–3.0)

## 2019-08-22 NOTE — Patient Instructions (Signed)
Hold warfarin tonight then resume 1/2 tablet daily except 1 tablet on Mondays and Thursdays Recheck in 3 wks

## 2019-09-17 ENCOUNTER — Ambulatory Visit (INDEPENDENT_AMBULATORY_CARE_PROVIDER_SITE_OTHER): Payer: Medicare PPO | Admitting: *Deleted

## 2019-09-17 ENCOUNTER — Other Ambulatory Visit: Payer: Self-pay

## 2019-09-17 DIAGNOSIS — I480 Paroxysmal atrial fibrillation: Secondary | ICD-10-CM | POA: Diagnosis not present

## 2019-09-17 DIAGNOSIS — Z5181 Encounter for therapeutic drug level monitoring: Secondary | ICD-10-CM | POA: Diagnosis not present

## 2019-09-17 LAB — POCT INR
INR: 7.2 — AB (ref 2.0–3.0)
INR: 7.2 — AB (ref 2.0–3.0)

## 2019-09-17 NOTE — Patient Instructions (Signed)
Hold warfarin x 3 days Come back for INR check on 09/20/19. Denies any signs of bleeding.  Bleeding and fall precautions discussed with pt and CG and they verbalized understanding.

## 2019-09-20 ENCOUNTER — Other Ambulatory Visit: Payer: Self-pay

## 2019-09-20 ENCOUNTER — Ambulatory Visit (INDEPENDENT_AMBULATORY_CARE_PROVIDER_SITE_OTHER): Payer: Medicare PPO | Admitting: *Deleted

## 2019-09-20 DIAGNOSIS — Z5181 Encounter for therapeutic drug level monitoring: Secondary | ICD-10-CM | POA: Diagnosis not present

## 2019-09-20 DIAGNOSIS — I480 Paroxysmal atrial fibrillation: Secondary | ICD-10-CM

## 2019-09-20 LAB — POCT INR: INR: 2.6 (ref 2.0–3.0)

## 2019-09-20 NOTE — Patient Instructions (Signed)
Decrease coumadin to 1/2 tablet daily Recheck in 3 weeks

## 2019-10-17 ENCOUNTER — Other Ambulatory Visit: Payer: Self-pay

## 2019-10-17 ENCOUNTER — Ambulatory Visit (INDEPENDENT_AMBULATORY_CARE_PROVIDER_SITE_OTHER): Payer: Medicare PPO | Admitting: *Deleted

## 2019-10-17 DIAGNOSIS — Z5181 Encounter for therapeutic drug level monitoring: Secondary | ICD-10-CM

## 2019-10-17 DIAGNOSIS — I480 Paroxysmal atrial fibrillation: Secondary | ICD-10-CM

## 2019-10-17 LAB — POCT INR: INR: 4.4 — AB (ref 2.0–3.0)

## 2019-10-17 NOTE — Patient Instructions (Signed)
Hold warfarin tonight and tomorrow night then resume 1/2 tablet daily Recheck in 2 weeks

## 2019-10-29 ENCOUNTER — Other Ambulatory Visit: Payer: Self-pay

## 2019-10-29 ENCOUNTER — Ambulatory Visit (INDEPENDENT_AMBULATORY_CARE_PROVIDER_SITE_OTHER): Payer: Medicare PPO | Admitting: *Deleted

## 2019-10-29 DIAGNOSIS — I480 Paroxysmal atrial fibrillation: Secondary | ICD-10-CM

## 2019-10-29 DIAGNOSIS — Z5181 Encounter for therapeutic drug level monitoring: Secondary | ICD-10-CM | POA: Diagnosis not present

## 2019-10-29 LAB — POCT INR: INR: 2.3 (ref 2.0–3.0)

## 2019-10-29 NOTE — Patient Instructions (Signed)
Continue warfarin 1/2 tablet daily Recheck in 4 weeks 

## 2019-11-20 ENCOUNTER — Other Ambulatory Visit: Payer: Self-pay

## 2019-11-20 ENCOUNTER — Ambulatory Visit (INDEPENDENT_AMBULATORY_CARE_PROVIDER_SITE_OTHER): Payer: Medicare PPO | Admitting: *Deleted

## 2019-11-20 DIAGNOSIS — I480 Paroxysmal atrial fibrillation: Secondary | ICD-10-CM

## 2019-11-20 DIAGNOSIS — Z5181 Encounter for therapeutic drug level monitoring: Secondary | ICD-10-CM | POA: Diagnosis not present

## 2019-11-20 LAB — POCT INR: INR: 3 (ref 2.0–3.0)

## 2019-11-20 NOTE — Patient Instructions (Signed)
Continue warfarin 1/2 tablet daily Recheck in 4 weeks 

## 2019-12-19 ENCOUNTER — Other Ambulatory Visit: Payer: Self-pay

## 2019-12-19 ENCOUNTER — Ambulatory Visit (INDEPENDENT_AMBULATORY_CARE_PROVIDER_SITE_OTHER): Payer: Medicare PPO | Admitting: *Deleted

## 2019-12-19 DIAGNOSIS — I480 Paroxysmal atrial fibrillation: Secondary | ICD-10-CM | POA: Diagnosis not present

## 2019-12-19 DIAGNOSIS — Z5181 Encounter for therapeutic drug level monitoring: Secondary | ICD-10-CM | POA: Diagnosis not present

## 2019-12-19 LAB — POCT INR: INR: 3.4 — AB (ref 2.0–3.0)

## 2019-12-19 NOTE — Patient Instructions (Signed)
Decrease dose to 1/2 tablet daily except none on Wednesdays Recheck in 4 weeks

## 2020-01-21 ENCOUNTER — Other Ambulatory Visit: Payer: Self-pay

## 2020-01-21 ENCOUNTER — Ambulatory Visit (INDEPENDENT_AMBULATORY_CARE_PROVIDER_SITE_OTHER): Payer: Medicare HMO | Admitting: *Deleted

## 2020-01-21 DIAGNOSIS — I480 Paroxysmal atrial fibrillation: Secondary | ICD-10-CM | POA: Diagnosis not present

## 2020-01-21 DIAGNOSIS — Z5181 Encounter for therapeutic drug level monitoring: Secondary | ICD-10-CM

## 2020-01-21 LAB — POCT INR: INR: 1.2 — AB (ref 2.0–3.0)

## 2020-01-21 NOTE — Patient Instructions (Signed)
Take 1 tablet tonight then increase dose back to 1/2 tablet daily Recheck in 3 weeks

## 2020-02-07 ENCOUNTER — Ambulatory Visit (INDEPENDENT_AMBULATORY_CARE_PROVIDER_SITE_OTHER): Payer: Medicare HMO | Admitting: Pharmacist

## 2020-02-07 DIAGNOSIS — Z5181 Encounter for therapeutic drug level monitoring: Secondary | ICD-10-CM | POA: Diagnosis not present

## 2020-02-07 DIAGNOSIS — Z7901 Long term (current) use of anticoagulants: Secondary | ICD-10-CM

## 2020-02-07 DIAGNOSIS — I480 Paroxysmal atrial fibrillation: Secondary | ICD-10-CM | POA: Diagnosis not present

## 2020-02-07 LAB — POCT INR: INR: 1.4 — AB (ref 2.0–3.0)

## 2020-02-07 NOTE — Patient Instructions (Signed)
Description   Take 1 tablet today and tomorrow, then start taking 1/2 tablet daily except 1 tablet on Sundays Recheck in 1 week Started drinking 2-3 Glucerna per day

## 2020-02-20 ENCOUNTER — Other Ambulatory Visit: Payer: Self-pay

## 2020-02-20 ENCOUNTER — Ambulatory Visit (INDEPENDENT_AMBULATORY_CARE_PROVIDER_SITE_OTHER): Payer: Medicare HMO | Admitting: *Deleted

## 2020-02-20 DIAGNOSIS — Z5181 Encounter for therapeutic drug level monitoring: Secondary | ICD-10-CM | POA: Diagnosis not present

## 2020-02-20 DIAGNOSIS — I480 Paroxysmal atrial fibrillation: Secondary | ICD-10-CM

## 2020-02-20 LAB — POCT INR: INR: 2.1 (ref 2.0–3.0)

## 2020-02-20 NOTE — Patient Instructions (Signed)
Continue warfarin`1/2 tablet daily except 1 tablet on Sundays Recheck in 3 weeks Not drinking the Glucerna except on occasion per daughter

## 2020-03-12 ENCOUNTER — Ambulatory Visit (INDEPENDENT_AMBULATORY_CARE_PROVIDER_SITE_OTHER): Payer: Medicare HMO | Admitting: *Deleted

## 2020-03-12 DIAGNOSIS — I480 Paroxysmal atrial fibrillation: Secondary | ICD-10-CM

## 2020-03-12 DIAGNOSIS — Z5181 Encounter for therapeutic drug level monitoring: Secondary | ICD-10-CM

## 2020-03-12 LAB — POCT INR: INR: 2.2 (ref 2.0–3.0)

## 2020-03-12 NOTE — Patient Instructions (Signed)
Continue warfarin`1/2 tablet daily except 1 tablet on Sundays Recheck in 4 weeks Not drinking the Glucerna except on occasion per daughter

## 2020-04-14 ENCOUNTER — Ambulatory Visit (INDEPENDENT_AMBULATORY_CARE_PROVIDER_SITE_OTHER): Payer: Medicare HMO | Admitting: *Deleted

## 2020-04-14 DIAGNOSIS — I480 Paroxysmal atrial fibrillation: Secondary | ICD-10-CM

## 2020-04-14 DIAGNOSIS — Z5181 Encounter for therapeutic drug level monitoring: Secondary | ICD-10-CM

## 2020-04-14 LAB — POCT INR: INR: 1.2 — AB (ref 2.0–3.0)

## 2020-04-14 NOTE — Patient Instructions (Signed)
Take 1 tablet tonight and tomorrow night then continue 1/2 tablet daily except 1 tablet on Sundays Recheck in 3 weeks (Could not come in 2 wks) Not drinking the Glucerna except on occasion per daughter

## 2020-05-06 ENCOUNTER — Ambulatory Visit (INDEPENDENT_AMBULATORY_CARE_PROVIDER_SITE_OTHER): Payer: Medicare HMO | Admitting: Pharmacist

## 2020-05-06 DIAGNOSIS — Z7901 Long term (current) use of anticoagulants: Secondary | ICD-10-CM

## 2020-05-06 DIAGNOSIS — Z5181 Encounter for therapeutic drug level monitoring: Secondary | ICD-10-CM | POA: Diagnosis not present

## 2020-05-06 DIAGNOSIS — I480 Paroxysmal atrial fibrillation: Secondary | ICD-10-CM

## 2020-05-06 LAB — POCT INR: INR: 4.5 — AB (ref 2.0–3.0)

## 2020-05-06 NOTE — Patient Instructions (Signed)
Description   Skip Coumadin today and tomorrow, then continue 1/2 tablet daily except 1 tablet on Sundays Recheck in 2 weeks. Not drinking the Glucerna except on occasion per daughter

## 2020-05-22 ENCOUNTER — Ambulatory Visit (INDEPENDENT_AMBULATORY_CARE_PROVIDER_SITE_OTHER): Payer: Medicare HMO | Admitting: *Deleted

## 2020-05-22 DIAGNOSIS — Z5181 Encounter for therapeutic drug level monitoring: Secondary | ICD-10-CM

## 2020-05-22 DIAGNOSIS — I480 Paroxysmal atrial fibrillation: Secondary | ICD-10-CM

## 2020-05-22 LAB — POCT INR: INR: 3.3 — AB (ref 2.0–3.0)

## 2020-05-22 NOTE — Patient Instructions (Signed)
Skip Coumadin today then decrease dose to 1/2 tablet daily  Recheck in 3 weeks. Not drinking the Glucerna except on occasion per daughter

## 2020-06-17 ENCOUNTER — Ambulatory Visit (INDEPENDENT_AMBULATORY_CARE_PROVIDER_SITE_OTHER): Payer: Medicare HMO | Admitting: *Deleted

## 2020-06-17 DIAGNOSIS — Z5181 Encounter for therapeutic drug level monitoring: Secondary | ICD-10-CM

## 2020-06-17 DIAGNOSIS — I480 Paroxysmal atrial fibrillation: Secondary | ICD-10-CM

## 2020-06-17 LAB — POCT INR: INR: 4.4 — AB (ref 2.0–3.0)

## 2020-06-17 NOTE — Patient Instructions (Signed)
Skip Coumadin today and tomorrow then decrease dose to 1/2 tablet daily except none on Sundays Recheck in 2 weeks. Not drinking the Glucerna except on occasion per daughter

## 2020-07-01 ENCOUNTER — Ambulatory Visit (HOSPITAL_COMMUNITY)
Admission: RE | Admit: 2020-07-01 | Discharge: 2020-07-01 | Disposition: A | Discharge status: Home / Self Care | Payer: Medicare HMO | Source: Ambulatory Visit | Attending: Family Medicine | Admitting: Family Medicine

## 2020-07-01 ENCOUNTER — Other Ambulatory Visit: Payer: Self-pay

## 2020-07-01 ENCOUNTER — Other Ambulatory Visit (HOSPITAL_COMMUNITY): Payer: Self-pay | Admitting: Family Medicine

## 2020-07-01 DIAGNOSIS — M898X1 Other specified disorders of bone, shoulder: Secondary | ICD-10-CM

## 2020-07-08 ENCOUNTER — Ambulatory Visit (INDEPENDENT_AMBULATORY_CARE_PROVIDER_SITE_OTHER): Payer: Medicare HMO | Admitting: *Deleted

## 2020-07-08 DIAGNOSIS — I480 Paroxysmal atrial fibrillation: Secondary | ICD-10-CM

## 2020-07-08 DIAGNOSIS — Z5181 Encounter for therapeutic drug level monitoring: Secondary | ICD-10-CM

## 2020-07-08 LAB — POCT INR: INR: 5.1 — AB (ref 2.0–3.0)

## 2020-07-08 MED ORDER — WARFARIN SODIUM 1 MG PO TABS: 1.0000 mg | ORAL_TABLET | Freq: Every day | ORAL | 1 refills | Status: AC

## 2020-07-08 NOTE — Patient Instructions (Signed)
Warfarin changed from 5mg  tablet to 1mg  tablet Hold coumadin x 3 days (Tues,Wed,Thurs) then decrease dose to 1mg  (1 tablet) daily Recheck in 1 week Not drinking the Glucerna except on occasion per daughter

## 2020-07-16 ENCOUNTER — Other Ambulatory Visit: Payer: Self-pay

## 2020-07-16 ENCOUNTER — Ambulatory Visit (INDEPENDENT_AMBULATORY_CARE_PROVIDER_SITE_OTHER): Payer: Medicare HMO | Admitting: *Deleted

## 2020-07-16 DIAGNOSIS — Z5181 Encounter for therapeutic drug level monitoring: Secondary | ICD-10-CM

## 2020-07-16 DIAGNOSIS — I480 Paroxysmal atrial fibrillation: Secondary | ICD-10-CM

## 2020-07-16 LAB — POCT INR: INR: 2 (ref 2.0–3.0)

## 2020-07-16 NOTE — Patient Instructions (Signed)
Warfarin changed from 5mg  tablet to 1mg  tablet Continue warfarin 1mg  (1 tablet) daily Recheck in 2 week Not drinking the Glucerna except on occasion per daughter

## 2020-07-30 ENCOUNTER — Ambulatory Visit (INDEPENDENT_AMBULATORY_CARE_PROVIDER_SITE_OTHER): Payer: Medicare HMO | Admitting: *Deleted

## 2020-07-30 DIAGNOSIS — Z5181 Encounter for therapeutic drug level monitoring: Secondary | ICD-10-CM | POA: Diagnosis not present

## 2020-07-30 DIAGNOSIS — I480 Paroxysmal atrial fibrillation: Secondary | ICD-10-CM

## 2020-07-30 LAB — POCT INR: INR: 1.5 — AB (ref 2.0–3.0)

## 2020-07-30 NOTE — Patient Instructions (Signed)
Warfarin changed from 5mg  tablet to 1mg  tablet Increase warfarin to 1 tablet daily except 2 tablets on Wednesdays and Saturdays Recheck in 2 weeks Not drinking the Glucerna except on occasion per daughter

## 2020-09-11 ENCOUNTER — Telehealth: Payer: Self-pay | Admitting: *Deleted

## 2020-09-11 NOTE — Telephone Encounter (Signed)
Pt's daughter called and states that pt is now in Hospice. Daughter states that Hospice wants to stop doing INR checks on patient. Daughter would like to know how you feel about this. Please advise.

## 2020-09-11 NOTE — Telephone Encounter (Signed)
Returned call to daughter. No answer left msg to return call.

## 2020-09-11 NOTE — Telephone Encounter (Signed)
Some time since we have seen her, I reviewed some of the notes from [facility] where her cancer is being treated. What estimated life expectancy have they given her? If very short time period would be reasonable to stop coumadin and INR checks. If a few months could consider changing to an oral anticoagulant that would not require INR checks such as eliquis or xarelto   Carlyle Dolly MD

## 2020-09-15 NOTE — Telephone Encounter (Signed)
Spoke with Suanne Marker and Helene Kelp Dr. Nelly Laurence recommendations given Helene Kelp will call and notify Hospice.

## 2020-09-19 ENCOUNTER — Ambulatory Visit: Payer: Self-pay | Admitting: *Deleted

## 2020-10-13 ENCOUNTER — Other Ambulatory Visit: Payer: Self-pay | Admitting: Cardiology

## 2020-10-28 DEATH — deceased
